# Patient Record
Sex: Male | Born: 1945 | Race: White | Hispanic: No | Marital: Married | Smoking: Never smoker
Health system: Southern US, Community
[De-identification: ages and names within clinical notes are randomized; demographics above are authoritative.]

## PROBLEM LIST (undated history)

## (undated) DIAGNOSIS — N179 Acute kidney failure, unspecified: Secondary | ICD-10-CM

## (undated) DIAGNOSIS — Z9289 Personal history of other medical treatment: Secondary | ICD-10-CM

## (undated) DIAGNOSIS — R51 Headache: Secondary | ICD-10-CM

## (undated) DIAGNOSIS — R112 Nausea with vomiting, unspecified: Secondary | ICD-10-CM

## (undated) DIAGNOSIS — R519 Headache, unspecified: Secondary | ICD-10-CM

## (undated) DIAGNOSIS — K219 Gastro-esophageal reflux disease without esophagitis: Secondary | ICD-10-CM

## (undated) DIAGNOSIS — R011 Cardiac murmur, unspecified: Secondary | ICD-10-CM

## (undated) DIAGNOSIS — Z9889 Other specified postprocedural states: Secondary | ICD-10-CM

## (undated) DIAGNOSIS — C181 Malignant neoplasm of appendix: Secondary | ICD-10-CM

## (undated) DIAGNOSIS — F419 Anxiety disorder, unspecified: Secondary | ICD-10-CM

## (undated) HISTORY — PX: MOLE REMOVAL: SHX2046

## (undated) HISTORY — PX: APPENDECTOMY: SHX54

## (undated) HISTORY — PX: FRACTURE SURGERY: SHX138

## (undated) HISTORY — PX: JOINT REPLACEMENT: SHX530

---

## 1971-06-25 HISTORY — PX: VASECTOMY: SHX75

## 1972-06-24 HISTORY — PX: DENTAL SURGERY: SHX609

## 2010-06-24 DIAGNOSIS — Z9289 Personal history of other medical treatment: Secondary | ICD-10-CM

## 2010-06-24 HISTORY — DX: Personal history of other medical treatment: Z92.89

## 2010-09-22 ENCOUNTER — Inpatient Hospital Stay (HOSPITAL_COMMUNITY)
Admission: EM | Admit: 2010-09-22 | Discharge: 2010-10-02 | DRG: 493 | Disposition: A | Discharge status: Skilled Nursing Facility | Payer: No Typology Code available for payment source | Attending: Orthopedic Surgery | Admitting: Orthopedic Surgery

## 2010-09-22 ENCOUNTER — Emergency Department (HOSPITAL_COMMUNITY): Payer: No Typology Code available for payment source

## 2010-09-22 DIAGNOSIS — S82109A Unspecified fracture of upper end of unspecified tibia, initial encounter for closed fracture: Secondary | ICD-10-CM | POA: Diagnosis present

## 2010-09-22 DIAGNOSIS — K219 Gastro-esophageal reflux disease without esophagitis: Secondary | ICD-10-CM | POA: Diagnosis present

## 2010-09-22 DIAGNOSIS — Y998 Other external cause status: Secondary | ICD-10-CM

## 2010-09-22 DIAGNOSIS — Y92410 Unspecified street and highway as the place of occurrence of the external cause: Secondary | ICD-10-CM

## 2010-09-22 DIAGNOSIS — S82209A Unspecified fracture of shaft of unspecified tibia, initial encounter for closed fracture: Principal | ICD-10-CM | POA: Diagnosis present

## 2010-09-22 DIAGNOSIS — IMO0002 Reserved for concepts with insufficient information to code with codable children: Secondary | ICD-10-CM

## 2010-09-22 DIAGNOSIS — T79A29A Traumatic compartment syndrome of unspecified lower extremity, initial encounter: Secondary | ICD-10-CM | POA: Diagnosis present

## 2010-09-22 LAB — CBC
MCH: 30 pg (ref 26.0–34.0)
MCV: 84.9 fL (ref 78.0–100.0)
Platelets: 128 10*3/uL — ABNORMAL LOW (ref 150–400)
RBC: 4.84 MIL/uL (ref 4.22–5.81)

## 2010-09-22 LAB — POCT I-STAT, CHEM 8
BUN: 16 mg/dL (ref 6–23)
Calcium, Ion: 0.96 mmol/L — ABNORMAL LOW (ref 1.12–1.32)
Chloride: 110 meq/L (ref 96–112)
Creatinine, Ser: 1.2 mg/dL (ref 0.4–1.5)
Glucose, Bld: 104 mg/dL — ABNORMAL HIGH (ref 70–99)

## 2010-09-22 LAB — DIFFERENTIAL
Eosinophils Absolute: 0 10*3/uL (ref 0.0–0.7)
Lymphs Abs: 0.9 10*3/uL (ref 0.7–4.0)
Monocytes Relative: 7 % (ref 3–12)
Neutrophils Relative %: 85 % — ABNORMAL HIGH (ref 43–77)

## 2010-09-23 ENCOUNTER — Inpatient Hospital Stay (HOSPITAL_COMMUNITY): Payer: No Typology Code available for payment source

## 2010-09-23 HISTORY — PX: EXTERNAL FIXATION LEG: SHX1549

## 2010-09-23 HISTORY — PX: TIBIA FRACTURE SURGERY: SHX806

## 2010-09-23 LAB — BASIC METABOLIC PANEL
BUN: 12 mg/dL (ref 6–23)
CO2: 23 mEq/L (ref 19–32)
Chloride: 99 mEq/L (ref 96–112)
Creatinine, Ser: 1.08 mg/dL (ref 0.4–1.5)

## 2010-09-23 LAB — URINE MICROSCOPIC-ADD ON

## 2010-09-23 LAB — URINALYSIS, ROUTINE W REFLEX MICROSCOPIC
Glucose, UA: NEGATIVE mg/dL
Leukocytes, UA: NEGATIVE
pH: 5 (ref 5.0–8.0)

## 2010-09-23 LAB — CBC
HCT: 34.4 % — ABNORMAL LOW (ref 39.0–52.0)
MCHC: 34.9 g/dL (ref 30.0–36.0)
MCV: 86 fL (ref 78.0–100.0)
RDW: 13.7 % (ref 11.5–15.5)

## 2010-09-24 LAB — BASIC METABOLIC PANEL
CO2: 27 mEq/L (ref 19–32)
Calcium: 7.7 mg/dL — ABNORMAL LOW (ref 8.4–10.5)
Chloride: 102 mEq/L (ref 96–112)
Glucose, Bld: 132 mg/dL — ABNORMAL HIGH (ref 70–99)
Sodium: 134 mEq/L — ABNORMAL LOW (ref 135–145)

## 2010-09-24 LAB — CBC
MCH: 29.8 pg (ref 26.0–34.0)
MCV: 85.7 fL (ref 78.0–100.0)
Platelets: 116 10*3/uL — ABNORMAL LOW (ref 150–400)
RBC: 3.36 MIL/uL — ABNORMAL LOW (ref 4.22–5.81)
RDW: 13.8 % (ref 11.5–15.5)

## 2010-09-24 LAB — URINE CULTURE
Culture  Setup Time: 201204011134
Culture: NO GROWTH

## 2010-09-25 LAB — CBC
HCT: 24.6 % — ABNORMAL LOW (ref 39.0–52.0)
Platelets: 100 10*3/uL — ABNORMAL LOW (ref 150–400)
RDW: 13.7 % (ref 11.5–15.5)
WBC: 5.3 10*3/uL (ref 4.0–10.5)

## 2010-09-25 LAB — BASIC METABOLIC PANEL
BUN: 7 mg/dL (ref 6–23)
CO2: 28 mEq/L (ref 19–32)
Calcium: 7.8 mg/dL — ABNORMAL LOW (ref 8.4–10.5)
Creatinine, Ser: 0.98 mg/dL (ref 0.4–1.5)
GFR calc Af Amer: >60 mL/min (ref >60)

## 2010-09-25 LAB — PROTIME-INR
INR: 2.1 — ABNORMAL HIGH (ref 0.00–1.49)
Prothrombin Time: 23.7 seconds — ABNORMAL HIGH (ref 11.6–15.2)

## 2010-09-26 LAB — SURGICAL PCR SCREEN: MRSA, PCR: NEGATIVE

## 2010-09-27 ENCOUNTER — Inpatient Hospital Stay (HOSPITAL_COMMUNITY): Payer: No Typology Code available for payment source

## 2010-09-27 LAB — PROTIME-INR: INR: 1.89 — ABNORMAL HIGH (ref 0.00–1.49)

## 2010-09-28 ENCOUNTER — Inpatient Hospital Stay (HOSPITAL_COMMUNITY): Payer: No Typology Code available for payment source

## 2010-09-28 LAB — BASIC METABOLIC PANEL
Calcium: 7.8 mg/dL — ABNORMAL LOW (ref 8.4–10.5)
GFR calc Af Amer: >60 mL/min (ref >60)
GFR calc non Af Amer: >60 mL/min (ref >60)
Glucose, Bld: 131 mg/dL — ABNORMAL HIGH (ref 70–99)
Potassium: 4.3 mEq/L (ref 3.5–5.1)
Sodium: 129 mEq/L — ABNORMAL LOW (ref 135–145)

## 2010-09-28 LAB — PROTIME-INR
INR: 2.8 — ABNORMAL HIGH (ref 0.00–1.49)
Prothrombin Time: 29.6 seconds — ABNORMAL HIGH (ref 11.6–15.2)

## 2010-09-28 LAB — CBC
HCT: 22 % — ABNORMAL LOW (ref 39.0–52.0)
MCHC: 34.5 g/dL (ref 30.0–36.0)
Platelets: 187 10*3/uL (ref 150–400)
RDW: 13.6 % (ref 11.5–15.5)
WBC: 7.8 10*3/uL (ref 4.0–10.5)

## 2010-09-28 LAB — DIFFERENTIAL
Basophils Absolute: 0 10*3/uL (ref 0.0–0.1)
Basophils Relative: 0 % (ref 0–1)
Eosinophils Relative: 1 % (ref 0–5)
Lymphocytes Relative: 15 % (ref 12–46)
Monocytes Absolute: 0.9 10*3/uL (ref 0.1–1.0)

## 2010-09-29 LAB — CBC
HCT: 22.3 % — ABNORMAL LOW (ref 39.0–52.0)
MCHC: 34.5 g/dL (ref 30.0–36.0)
Platelets: 192 10*3/uL (ref 150–400)
RDW: 13.8 % (ref 11.5–15.5)
WBC: 7.6 10*3/uL (ref 4.0–10.5)

## 2010-09-29 LAB — BASIC METABOLIC PANEL
BUN: 13 mg/dL (ref 6–23)
Calcium: 7.9 mg/dL — ABNORMAL LOW (ref 8.4–10.5)
GFR calc non Af Amer: >60 mL/min (ref >60)
Glucose, Bld: 152 mg/dL — ABNORMAL HIGH (ref 70–99)
Sodium: 130 mEq/L — ABNORMAL LOW (ref 135–145)

## 2010-09-29 LAB — DIFFERENTIAL
Basophils Absolute: 0 10*3/uL (ref 0.0–0.1)
Eosinophils Absolute: 0.1 10*3/uL (ref 0.0–0.7)
Eosinophils Relative: 1 % (ref 0–5)
Lymphocytes Relative: 17 % (ref 12–46)
Monocytes Absolute: 0.7 10*3/uL (ref 0.1–1.0)

## 2010-09-29 LAB — URINALYSIS, ROUTINE W REFLEX MICROSCOPIC
Bilirubin Urine: NEGATIVE
Ketones, ur: NEGATIVE mg/dL
Nitrite: NEGATIVE
Urobilinogen, UA: 0.2 mg/dL (ref 0.0–1.0)

## 2010-09-30 ENCOUNTER — Inpatient Hospital Stay (HOSPITAL_COMMUNITY): Payer: No Typology Code available for payment source

## 2010-09-30 LAB — BASIC METABOLIC PANEL
BUN: 12 mg/dL (ref 6–23)
CO2: 28 mEq/L (ref 19–32)
Calcium: 8 mg/dL — ABNORMAL LOW (ref 8.4–10.5)
Creatinine, Ser: 0.96 mg/dL (ref 0.4–1.5)
Glucose, Bld: 114 mg/dL — ABNORMAL HIGH (ref 70–99)
Sodium: 135 mEq/L (ref 135–145)

## 2010-09-30 LAB — DIFFERENTIAL
Eosinophils Absolute: 0.1 10*3/uL (ref 0.0–0.7)
Eosinophils Relative: 3 % (ref 0–5)
Lymphs Abs: 1.2 10*3/uL (ref 0.7–4.0)
Monocytes Absolute: 0.5 10*3/uL (ref 0.1–1.0)
Monocytes Relative: 9 % (ref 3–12)

## 2010-09-30 LAB — CBC
MCH: 28.9 pg (ref 26.0–34.0)
MCHC: 33.6 g/dL (ref 30.0–36.0)
MCV: 86.1 fL (ref 78.0–100.0)
Platelets: 187 10*3/uL (ref 150–400)
RDW: 14 % (ref 11.5–15.5)
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
## 2010-10-02 LAB — CROSSMATCH
ABO/RH(D): O NEG
Antibody Screen: NEGATIVE
Unit division: 0

## 2010-10-02 LAB — PROTIME-INR: Prothrombin Time: 22.7 seconds — ABNORMAL HIGH (ref 11.6–15.2)

## 2010-10-16 NOTE — Discharge Summary (Signed)
  Christopher Potts, Christopher Potts              ACCOUNT NO.:  [id number]  MEDICAL RECORD NO.:  [id number]           PATIENT TYPE:  I  LOCATION:  5003                         FACILITY:  [facility]  PHYSICIAN:  Nadara Mustard, MD     DATE OF BIRTH:  [birth year]  DATE OF ADMISSION:  09/22/2010 DATE OF DISCHARGE:  10/01/2010                              DISCHARGE SUMMARY   FINAL DIAGNOSES: 1. Right mid shaft tibia and fibular fracture. 2. Comminuted bicondylar tibial plateau fracture on the left as well     as avulsion of tibial tubercle.  Discharged to skilled nursing in stable condition.  Follow up with Dr. Lajoyce Corners in 1-2 weeks.  MEDICATIONS:  As per his admission medications.  NEW MEDICATIONS: 1. Vicodin 1-2 p.o. q.4 h. p.r.n. for pain. 2. Coumadin 1 mg p.o. daily.  INR measurements not necessary, Coumadin     for 30 days. 3. Bactroban prescription for pin tract care.  The patient's pin     tracts are to be cleaned with half-strength hydrogen peroxide twice     a day and then Bactroban cream applied to the pin tracts on the     left external fixator 2 times a day and Bactroban to be applied 2     times a day to the abrasion wound over the tibial tubercle left     knee.  Physical therapy, transfer training, weightbearing as     tolerated on the right with strict nonweightbearing on the left     lower extremity.  Discharged to skilled nursing in stable     condition.     Nadara Mustard, MD     MVD/MEDQ  D:  10/01/2010  T:  10/01/2010  Job:  [id number]  Electronically Signed by [name] MD on 10/16/2010 02:55:29 PM

## 2011-08-23 HISTORY — PX: ILEOSTOMY: SHX1783

## 2011-08-23 HISTORY — PX: COLECTOMY: SHX59

## 2011-08-30 ENCOUNTER — Other Ambulatory Visit (HOSPITAL_COMMUNITY): Payer: Self-pay | Admitting: Orthopedic Surgery

## 2011-09-04 ENCOUNTER — Encounter (HOSPITAL_COMMUNITY)
Admission: RE | Admit: 2011-09-04 | Discharge: 2011-09-04 | Disposition: A | Discharge status: Home / Self Care | Payer: Medicare Other | Source: Ambulatory Visit | Attending: Orthopedic Surgery | Admitting: Orthopedic Surgery

## 2011-09-04 ENCOUNTER — Encounter (HOSPITAL_COMMUNITY): Payer: Self-pay

## 2011-09-04 ENCOUNTER — Encounter (HOSPITAL_COMMUNITY): Payer: Self-pay | Admitting: Pharmacy Technician

## 2011-09-04 ENCOUNTER — Other Ambulatory Visit: Payer: Self-pay

## 2011-09-04 HISTORY — DX: Nausea with vomiting, unspecified: Z98.890

## 2011-09-04 HISTORY — DX: Cardiac murmur, unspecified: R01.1

## 2011-09-04 HISTORY — DX: Nausea with vomiting, unspecified: R11.2

## 2011-09-04 HISTORY — DX: Gastro-esophageal reflux disease without esophagitis: K21.9

## 2011-09-04 LAB — COMPREHENSIVE METABOLIC PANEL
Alkaline Phosphatase: 107 U/L (ref 39–117)
BUN: 12 mg/dL (ref 6–23)
Chloride: 105 mEq/L (ref 96–112)
GFR calc Af Amer: >90 mL/min (ref >90)
Glucose, Bld: 89 mg/dL (ref 70–99)
Potassium: 4.6 mEq/L (ref 3.5–5.1)
Total Bilirubin: 0.8 mg/dL (ref 0.3–1.2)

## 2011-09-04 LAB — CBC
HCT: 44.7 % (ref 39.0–52.0)
Hemoglobin: 16.2 g/dL (ref 13.0–17.0)
RBC: 5.3 MIL/uL (ref 4.22–5.81)
WBC: 4.3 10*3/uL (ref 4.0–10.5)

## 2011-09-04 LAB — APTT: aPTT: 32 seconds (ref 24–37)

## 2011-09-04 LAB — PROTIME-INR: Prothrombin Time: 13 seconds (ref 11.6–15.2)

## 2011-09-04 NOTE — Pre-Procedure Instructions (Addendum)
20 Christopher Potts  09/04/2011   Your procedure is scheduled on:  3.21.13  Report to [facility] at 530 AM.  Call this number if you have problems the morning of surgery: [phone]   Remember:   Do not eat food:After Midnight.  May have clear liquids: up to 4 Hours before arrival 130 am.  Clear liquids include soda, tea, black coffee, apple or grape juice, broth.  Take these medicines the morning of surgery with A SIP OF WATER: zantac, tylenol if needed   Do not wear jewelry, make-up or nail polish.  Do not wear lotions, powders, or perfumes. You may wear deodorant.  Do not shave 48 hours prior to surgery.  Do not bring valuables to the hospital.  Contacts, dentures or bridgework may not be worn into surgery.  Leave suitcase in the car. After surgery it may be brought to your room.  For patients admitted to the hospital, checkout time is 11:00 AM the day of discharge.   Patients discharged the day of surgery will not be allowed to drive home.  Name and phone number of your driver: [phone]  Special Instructions: CHG Shower Use Special Wash: 1/2 bottle night before surgery and 1/2 bottle morning of surgery.   Please read over the following fact sheets that you were given: Pain Booklet, Coughing and Deep Breathing, MRSA Information and Surgical Site Infection Prevention

## 2011-09-04 NOTE — Pre-Procedure Instructions (Signed)
20 Christopher Potts  09/04/2011   Your procedure is scheduled on: 3.21.13  Report to [facility] at 530 AM.  Call this number if you have problems the morning of surgery: [phone]   Remember:   Do not eat food:After Midnight.  May have clear liquids: up to 4 Hours before arrival 130 am.  Clear liquids include soda, tea, black coffee, apple or grape juice, broth.  Take these medicines the morning of surgery with A SIP OF WATER:    Do not wear jewelry, make-up or nail polish.  Do not wear lotions, powders, or perfumes. You may wear deodorant.  Do not shave 48 hours prior to surgery.  Do not bring valuables to the hospital.  Contacts, dentures or bridgework may not be worn into surgery.  Leave suitcase in the car. After surgery it may be brought to your room.  For patients admitted to the hospital, checkout time is 11:00 AM the day of discharge.   Patients discharged the day of surgery will not be allowed to drive home.  Name and phone number of your driver:   Special Instructions: Incentive Spirometry - Practice and bring it with you on the day of surgery. and CHG Shower Use Special Wash: 1/2 bottle night before surgery and 1/2 bottle morning of surgery.   Please read over the following fact sheets that you were given: Pain Booklet, Coughing and Deep Breathing, Blood Transfusion Information, Total Joint Packet, MRSA Information and Surgical Site Infection Prevention

## 2011-09-11 MED ORDER — CEFAZOLIN SODIUM-DEXTROSE 2-3 GM-% IV SOLR
2.0000 g | INTRAVENOUS | Status: DC
  Filled 2011-09-11: qty 50

## 2011-09-12 ENCOUNTER — Encounter (HOSPITAL_COMMUNITY)
Admission: RE | Disposition: A | Discharge status: Home / Self Care | Payer: Self-pay | Source: Ambulatory Visit | Attending: Orthopedic Surgery

## 2011-09-12 ENCOUNTER — Encounter (HOSPITAL_COMMUNITY): Payer: Self-pay | Admitting: Certified Registered"

## 2011-09-12 ENCOUNTER — Ambulatory Visit (HOSPITAL_COMMUNITY): Payer: Medicare Other

## 2011-09-12 ENCOUNTER — Ambulatory Visit (HOSPITAL_COMMUNITY): Payer: Medicare Other | Admitting: Certified Registered"

## 2011-09-12 ENCOUNTER — Inpatient Hospital Stay (HOSPITAL_COMMUNITY)
Admission: RE | Admit: 2011-09-12 | Discharge: 2011-09-16 | DRG: 470 | Disposition: A | Discharge status: Home Health | Payer: Medicare Other | Source: Ambulatory Visit | Attending: Orthopedic Surgery | Admitting: Orthopedic Surgery

## 2011-09-12 DIAGNOSIS — M12569 Traumatic arthropathy, unspecified knee: Principal | ICD-10-CM | POA: Diagnosis present

## 2011-09-12 DIAGNOSIS — Z01812 Encounter for preprocedural laboratory examination: Secondary | ICD-10-CM

## 2011-09-12 DIAGNOSIS — M1710 Unilateral primary osteoarthritis, unspecified knee: Secondary | ICD-10-CM

## 2011-09-12 DIAGNOSIS — K219 Gastro-esophageal reflux disease without esophagitis: Secondary | ICD-10-CM | POA: Diagnosis present

## 2011-09-12 HISTORY — PX: TOTAL KNEE ARTHROPLASTY: SHX125

## 2011-09-12 SURGERY — ARTHROPLASTY, KNEE, TOTAL
Anesthesia: General | Site: Knee | Laterality: Left | Wound class: Clean

## 2011-09-12 MED ORDER — HYDROCODONE-ACETAMINOPHEN 5-325 MG PO TABS
1.0000 | ORAL_TABLET | ORAL | Status: DC | PRN
  Administered 2011-09-13 – 2011-09-14 (×3): 1 via ORAL
  Administered 2011-09-15: 2 via ORAL
  Filled 2011-09-12 (×2): qty 1
  Filled 2011-09-12: qty 2
  Filled 2011-09-12 (×2): qty 1

## 2011-09-12 MED ORDER — WARFARIN VIDEO: Freq: Once | Status: DC

## 2011-09-12 MED ORDER — KETOROLAC TROMETHAMINE 30 MG/ML IJ SOLN: 15.0000 mg | Freq: Once | INTRAMUSCULAR | Status: DC | PRN

## 2011-09-12 MED ORDER — SUFENTANIL CITRATE 50 MCG/ML IV SOLN
INTRAVENOUS | Status: DC | PRN
  Administered 2011-09-12: 35 ug via INTRAVENOUS
  Administered 2011-09-12 (×2): 10 ug via INTRAVENOUS
  Administered 2011-09-12: 5 ug via INTRAVENOUS
  Administered 2011-09-12 (×3): 10 ug via INTRAVENOUS

## 2011-09-12 MED ORDER — MORPHINE SULFATE 2 MG/ML IJ SOLN: 1.0000 mg | INTRAMUSCULAR | Status: DC | PRN

## 2011-09-12 MED ORDER — MORPHINE SULFATE (PF) 1 MG/ML IV SOLN
INTRAVENOUS | Status: DC
  Administered 2011-09-12: 17:00:00 via INTRAVENOUS
  Administered 2011-09-12: 4.5 mg via INTRAVENOUS
  Administered 2011-09-13: 23 mg via INTRAVENOUS
  Administered 2011-09-13: 9 mg via INTRAVENOUS

## 2011-09-12 MED ORDER — DIPHENHYDRAMINE HCL 50 MG/ML IJ SOLN: 12.5000 mg | Freq: Four times a day (QID) | INTRAMUSCULAR | Status: DC | PRN

## 2011-09-12 MED ORDER — VANCOMYCIN HCL 500 MG IV SOLR
500.0000 mg | INTRAVENOUS | Status: DC
  Filled 2011-09-12: qty 500

## 2011-09-12 MED ORDER — METHOCARBAMOL 100 MG/ML IJ SOLN
500.0000 mg | Freq: Four times a day (QID) | INTRAVENOUS | Status: DC | PRN
  Filled 2011-09-12: qty 5

## 2011-09-12 MED ORDER — FERROUS SULFATE 325 (65 FE) MG PO TABS
325.0000 mg | ORAL_TABLET | Freq: Three times a day (TID) | ORAL | Status: DC
  Administered 2011-09-12 – 2011-09-16 (×10): 325 mg via ORAL
  Filled 2011-09-12 (×14): qty 1

## 2011-09-12 MED ORDER — ONDANSETRON HCL 4 MG/2ML IJ SOLN
INTRAMUSCULAR | Status: DC | PRN
  Administered 2011-09-12 (×2): 4 mg via INTRAVENOUS

## 2011-09-12 MED ORDER — ONDANSETRON HCL 4 MG/2ML IJ SOLN
4.0000 mg | Freq: Four times a day (QID) | INTRAMUSCULAR | Status: DC | PRN
  Administered 2011-09-12 – 2011-09-13 (×3): 4 mg via INTRAVENOUS
  Filled 2011-09-12 (×2): qty 2

## 2011-09-12 MED ORDER — MIDAZOLAM HCL 5 MG/5ML IJ SOLN
INTRAMUSCULAR | Status: DC | PRN
  Administered 2011-09-12: 2 mg via INTRAVENOUS

## 2011-09-12 MED ORDER — LACTATED RINGERS IV SOLN
INTRAVENOUS | Status: DC | PRN
  Administered 2011-09-12 (×3): via INTRAVENOUS

## 2011-09-12 MED ORDER — COUMADIN BOOK
Freq: Once | Status: AC
  Administered 2011-09-12: 15:00:00
  Filled 2011-09-12: qty 1

## 2011-09-12 MED ORDER — NALOXONE HCL 0.4 MG/ML IJ SOLN: 0.4000 mg | INTRAMUSCULAR | Status: DC | PRN

## 2011-09-12 MED ORDER — MIDAZOLAM HCL 2 MG/2ML IJ SOLN: 0.5000 mg | Freq: Once | INTRAMUSCULAR | Status: DC | PRN

## 2011-09-12 MED ORDER — ACETAMINOPHEN 10 MG/ML IV SOLN
INTRAVENOUS | Status: AC
  Filled 2011-09-12: qty 100

## 2011-09-12 MED ORDER — CEFAZOLIN SODIUM 1-5 GM-% IV SOLN
INTRAVENOUS | Status: DC | PRN
  Administered 2011-09-12: 2 g via INTRAVENOUS

## 2011-09-12 MED ORDER — EPHEDRINE SULFATE 50 MG/ML IJ SOLN
INTRAMUSCULAR | Status: DC | PRN
  Administered 2011-09-12 (×4): 10 mg via INTRAVENOUS

## 2011-09-12 MED ORDER — LIDOCAINE HCL 4 % MT SOLN
OROMUCOSAL | Status: DC | PRN
  Administered 2011-09-12: 4 mL via TOPICAL

## 2011-09-12 MED ORDER — WARFARIN - PHARMACIST DOSING INPATIENT: Freq: Every day | Status: DC

## 2011-09-12 MED ORDER — FENTANYL CITRATE 0.05 MG/ML IJ SOLN
25.0000 ug | INTRAMUSCULAR | Status: DC | PRN
  Administered 2011-09-12 (×2): 50 ug via INTRAVENOUS

## 2011-09-12 MED ORDER — DIPHENHYDRAMINE HCL 12.5 MG/5ML PO ELIX: 12.5000 mg | ORAL_SOLUTION | Freq: Four times a day (QID) | ORAL | Status: DC | PRN

## 2011-09-12 MED ORDER — OXYCODONE-ACETAMINOPHEN 5-325 MG PO TABS: 1.0000 | ORAL_TABLET | ORAL | Status: DC | PRN

## 2011-09-12 MED ORDER — ONDANSETRON HCL 4 MG/2ML IJ SOLN
4.0000 mg | Freq: Four times a day (QID) | INTRAMUSCULAR | Status: DC | PRN
  Filled 2011-09-12: qty 2

## 2011-09-12 MED ORDER — DEXAMETHASONE SODIUM PHOSPHATE 4 MG/ML IJ SOLN
INTRAMUSCULAR | Status: DC | PRN
  Administered 2011-09-12: 4 mg via INTRAVENOUS

## 2011-09-12 MED ORDER — MEPERIDINE HCL 25 MG/ML IJ SOLN: 6.2500 mg | INTRAMUSCULAR | Status: DC | PRN

## 2011-09-12 MED ORDER — CEFAZOLIN SODIUM-DEXTROSE 2-3 GM-% IV SOLR
2.0000 g | Freq: Four times a day (QID) | INTRAVENOUS | Status: AC
  Administered 2011-09-12 – 2011-09-13 (×3): 2 g via INTRAVENOUS
  Filled 2011-09-12 (×3): qty 50

## 2011-09-12 MED ORDER — GENTAMICIN SULFATE 40 MG/ML IJ SOLN
INTRAMUSCULAR | Status: DC | PRN
  Administered 2011-09-12: 240 mg

## 2011-09-12 MED ORDER — METHOCARBAMOL 500 MG PO TABS
500.0000 mg | ORAL_TABLET | Freq: Four times a day (QID) | ORAL | Status: DC | PRN
  Administered 2011-09-13 – 2011-09-14 (×3): 500 mg via ORAL
  Filled 2011-09-12 (×4): qty 1

## 2011-09-12 MED ORDER — ONDANSETRON HCL 4 MG PO TABS: 4.0000 mg | ORAL_TABLET | Freq: Four times a day (QID) | ORAL | Status: DC | PRN

## 2011-09-12 MED ORDER — ACETAMINOPHEN 10 MG/ML IV SOLN
INTRAVENOUS | Status: DC | PRN
  Administered 2011-09-12: 1000 mg via INTRAVENOUS

## 2011-09-12 MED ORDER — PROPOFOL 10 MG/ML IV EMUL
INTRAVENOUS | Status: DC | PRN
  Administered 2011-09-12: 200 mg via INTRAVENOUS

## 2011-09-12 MED ORDER — SODIUM CHLORIDE 0.9 % IR SOLN
Status: DC | PRN
  Administered 2011-09-12: 1000 mL
  Administered 2011-09-12: 3000 mL

## 2011-09-12 MED ORDER — FAMOTIDINE 10 MG PO TABS
10.0000 mg | ORAL_TABLET | Freq: Every day | ORAL | Status: DC
  Administered 2011-09-13 – 2011-09-16 (×4): 10 mg via ORAL
  Filled 2011-09-12 (×4): qty 1

## 2011-09-12 MED ORDER — ROCURONIUM BROMIDE 100 MG/10ML IV SOLN
INTRAVENOUS | Status: DC | PRN
  Administered 2011-09-12: 50 mg via INTRAVENOUS
  Administered 2011-09-12 (×2): 10 mg via INTRAVENOUS

## 2011-09-12 MED ORDER — MORPHINE SULFATE (PF) 1 MG/ML IV SOLN
INTRAVENOUS | Status: AC
  Filled 2011-09-12: qty 25

## 2011-09-12 MED ORDER — SODIUM CHLORIDE 0.9 % IV SOLN
INTRAVENOUS | Status: DC
  Administered 2011-09-12: 20 mL/h via INTRAVENOUS

## 2011-09-12 MED ORDER — METOCLOPRAMIDE HCL 10 MG PO TABS: 5.0000 mg | ORAL_TABLET | Freq: Three times a day (TID) | ORAL | Status: DC | PRN

## 2011-09-12 MED ORDER — WARFARIN SODIUM 7.5 MG PO TABS
7.5000 mg | ORAL_TABLET | Freq: Once | ORAL | Status: AC
  Administered 2011-09-12: 7.5 mg via ORAL
  Filled 2011-09-12: qty 1

## 2011-09-12 MED ORDER — SODIUM CHLORIDE 0.9 % IJ SOLN: 9.0000 mL | INTRAMUSCULAR | Status: DC | PRN

## 2011-09-12 MED ORDER — GLYCOPYRROLATE 0.2 MG/ML IJ SOLN
INTRAMUSCULAR | Status: DC | PRN
  Administered 2011-09-12: .8 mg via INTRAVENOUS

## 2011-09-12 MED ORDER — PROMETHAZINE HCL 25 MG/ML IJ SOLN: 6.2500 mg | INTRAMUSCULAR | Status: DC | PRN

## 2011-09-12 MED ORDER — LIDOCAINE HCL (CARDIAC) 20 MG/ML IV SOLN
INTRAVENOUS | Status: DC | PRN
  Administered 2011-09-12: 100 mg via INTRAVENOUS

## 2011-09-12 MED ORDER — NEOSTIGMINE METHYLSULFATE 1 MG/ML IJ SOLN
INTRAMUSCULAR | Status: DC | PRN
  Administered 2011-09-12: 5 mg via INTRAVENOUS

## 2011-09-12 MED ORDER — VANCOMYCIN HCL 500 MG IV SOLR
INTRAVENOUS | Status: DC | PRN
  Administered 2011-09-12: 500 mg

## 2011-09-12 MED ORDER — ACETAMINOPHEN 325 MG PO TABS: 325.0000 mg | ORAL_TABLET | ORAL | Status: DC | PRN

## 2011-09-12 MED ORDER — METOCLOPRAMIDE HCL 5 MG/ML IJ SOLN
5.0000 mg | Freq: Three times a day (TID) | INTRAMUSCULAR | Status: DC | PRN
  Administered 2011-09-13: 10 mg via INTRAVENOUS
  Filled 2011-09-12: qty 2

## 2011-09-12 SURGICAL SUPPLY — 64 items
BLADE KNIFE  20 PERSONNA (BLADE) ×3
BLADE KNIFE 20 PERSONNA (BLADE) ×3 IMPLANT
BLADE SAG 18X100X1.27 (BLADE) ×2 IMPLANT
BLADE SAGITTAL 25.0X1.27X90 (BLADE) ×2 IMPLANT
BLADE SAW SAG 90X13X1.27 (BLADE) ×2 IMPLANT
BLADE SAW SGTL 13.0X1.19X90.0M (BLADE) ×2 IMPLANT
BNDG COHESIVE 6X5 TAN STRL LF (GAUZE/BANDAGES/DRESSINGS) ×2 IMPLANT
BONE CEMENT PALACOSE (Orthopedic Implant) ×4 IMPLANT
BOWL SMART MIX CTS (DISPOSABLE) ×2 IMPLANT
CEMENT BONE PALACOSE (Orthopedic Implant) ×2 IMPLANT
CLOTH BEACON ORANGE TIMEOUT ST (SAFETY) ×2 IMPLANT
COVER BACK TABLE 24X17X13 BIG (DRAPES) ×2 IMPLANT
COVER SURGICAL LIGHT HANDLE (MISCELLANEOUS) ×2 IMPLANT
CUFF TOURNIQUET SINGLE 34IN LL (TOURNIQUET CUFF) ×2 IMPLANT
CUFF TOURNIQUET SINGLE 44IN (TOURNIQUET CUFF) IMPLANT
DRAPE EXTREMITY T 121X128X90 (DRAPE) ×2 IMPLANT
DRAPE PROXIMA HALF (DRAPES) ×2 IMPLANT
DRAPE U-SHAPE 47X51 STRL (DRAPES) ×2 IMPLANT
DRSG ADAPTIC 3X8 NADH LF (GAUZE/BANDAGES/DRESSINGS) ×2 IMPLANT
DRSG PAD ABDOMINAL 8X10 ST (GAUZE/BANDAGES/DRESSINGS) ×2 IMPLANT
DURAPREP 26ML APPLICATOR (WOUND CARE) ×2 IMPLANT
ELECT REM PT RETURN 9FT ADLT (ELECTROSURGICAL) ×2
ELECTRODE REM PT RTRN 9FT ADLT (ELECTROSURGICAL) ×1 IMPLANT
FACESHIELD LNG OPTICON STERILE (SAFETY) ×2 IMPLANT
GLOVE BIOGEL PI IND STRL 9 (GLOVE) ×1 IMPLANT
GLOVE BIOGEL PI INDICATOR 9 (GLOVE) ×1
GLOVE SURG ORTHO 9.0 STRL STRW (GLOVE) ×2 IMPLANT
GOWN PREVENTION PLUS XLARGE (GOWN DISPOSABLE) ×2 IMPLANT
GOWN SRG XL XLNG 56XLVL 4 (GOWN DISPOSABLE) ×2 IMPLANT
GOWN STRL NON-REIN XL XLG LVL4 (GOWN DISPOSABLE) ×2
HANDPIECE INTERPULSE COAX TIP (DISPOSABLE) ×1
KIT BASIN OR (CUSTOM PROCEDURE TRAY) ×2 IMPLANT
KIT ROOM TURNOVER OR (KITS) ×2 IMPLANT
KIT STIMULAN RAPID CURE  10CC (Orthopedic Implant) ×1 IMPLANT
KIT STIMULAN RAPID CURE 10CC (Orthopedic Implant) ×1 IMPLANT
MANIFOLD NEPTUNE II (INSTRUMENTS) ×2 IMPLANT
NEEDLE SPNL 18GX3.5 QUINCKE PK (NEEDLE) ×2 IMPLANT
NS IRRIG 1000ML POUR BTL (IV SOLUTION) ×2 IMPLANT
PACK TOTAL JOINT (CUSTOM PROCEDURE TRAY) ×2 IMPLANT
PAD ARMBOARD 7.5X6 YLW CONV (MISCELLANEOUS) ×4 IMPLANT
PADDING CAST COTTON 6X4 STRL (CAST SUPPLIES) ×2 IMPLANT
PATELLA ZIMMER 32MM (Orthopedic Implant) ×2 IMPLANT
SET HNDPC FAN SPRY TIP SCT (DISPOSABLE) ×1 IMPLANT
SPONGE GAUZE 4X4 12PLY (GAUZE/BANDAGES/DRESSINGS) ×2 IMPLANT
STAPLER VISISTAT 35W (STAPLE) ×2 IMPLANT
STEM ST EXT ZIER 100X145X12X (Stem) ×1 IMPLANT
STEM ST EXT ZIMMER (Stem) ×3 IMPLANT
SUCTION FRAZIER TIP 10 FR DISP (SUCTIONS) ×2 IMPLANT
SUT VIC AB 0 CTB1 27 (SUTURE) ×4 IMPLANT
SUT VIC AB 1 CTX 36 (SUTURE) ×1
SUT VIC AB 1 CTX36XBRD ANBCTR (SUTURE) ×1 IMPLANT
SUT VIC AB 2-0 CTB1 (SUTURE) ×2 IMPLANT
SYR 50ML SLIP (SYRINGE) ×2 IMPLANT
TOWEL OR 17X24 6PK STRL BLUE (TOWEL DISPOSABLE) ×2 IMPLANT
TOWEL OR 17X26 10 PK STRL BLUE (TOWEL DISPOSABLE) ×2 IMPLANT
TRAY FOLEY CATH 14FR (SET/KITS/TRAYS/PACK) ×2 IMPLANT
WATER STERILE IRR 1000ML POUR (IV SOLUTION) ×6 IMPLANT
WRAP KNEE MAXI GEL POST OP (GAUZE/BANDAGES/DRESSINGS) ×2 IMPLANT
YANKAUER SUCT BULB TIP NO VENT (SUCTIONS) ×2 IMPLANT
rotating hinge knee articular surface, size F, 26m (Knees) ×2 IMPLANT
rotating hinge knee femoral compnent (Knees) ×2 IMPLANT
rotating hinge knee tibial componenet size 5 (Knees) ×2 IMPLANT
tibial half block augment size 5, 15 mm (Knees) ×2 IMPLANT
tibial half block augment, size 5, 15mm (Knees) ×2 IMPLANT

## 2011-09-12 NOTE — Progress Notes (Signed)
At 16:15 alerted by pt of bloody drainage from left knee onto bedding.  Noted moderate size bloody/serosanguinous stain on bedding, left knee dressing reinforced with abd pads and ace wrap. Drainage appear to be exiting at top of post op compressing dressing. Pt denies any increased pain or change in sensation in limb. capillary refill <3 secs, skin color appropriate and limb warm to touch. Elita Quick RN

## 2011-09-12 NOTE — Anesthesia Postprocedure Evaluation (Signed)
Anesthesia Post Note  Patient: Christopher Potts  Procedure(s) Performed: Procedure(s) (LRB): TOTAL KNEE ARTHROPLASTY (Left)  Anesthesia type: GA  Patient location: PACU  Post pain: Pain level controlled  Post assessment: Post-op Vital signs reviewed  Last Vitals:  Filed Vitals:   09/12/11 1145  BP: 140/78  Pulse: 95  Temp:   Resp: 12    Post vital signs: Reviewed  Level of consciousness: sedated  Complications: No apparent anesthesia complications
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
## 2011-09-12 NOTE — Anesthesia Procedure Notes (Signed)
Procedure Name: Intubation Date/Time: 09/12/2011 7:51 AM Performed by: Glendora Score A Pre-anesthesia Checklist: Patient identified, Emergency Drugs available, Suction available and Patient being monitored Patient Re-evaluated:Patient Re-evaluated prior to inductionOxygen Delivery Method: Circle system utilized Preoxygenation: Pre-oxygenation with 100% oxygen Intubation Type: IV induction Ventilation: Mask ventilation without difficulty Laryngoscope Size: Miller and 2 Grade View: Grade I Tube type: Oral Tube size: 7.0 mm Number of attempts: 1 Airway Equipment and Method: Stylet and LTA kit utilized Placement Confirmation: ETT inserted through vocal cords under direct vision,  positive ETCO2 and breath sounds checked- equal and bilateral Secured at: 23 cm Tube secured with: Tape Dental Injury: Teeth and Oropharynx as per pre-operative assessment

## 2011-09-12 NOTE — Progress Notes (Signed)
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 Fish Allergy Swelling    When eat fish and strawberries together but can eat separately   . Strawberry     When eaten with Fish, but can have separately     Patient Measurements:   Wt 95.1 kg Ht 73 inches  Vital Signs: Temp: 97.4 F (36.3 C) (03/21 1245) Temp src: Oral (03/21 0619) BP: 135/80 mmHg (03/21 1315) Pulse Rate: 82  (03/21 1315)  Labs: No results found for this basename: HGB:2,HCT:3,PLT:3,APTT:3,LABPROT:3,INR:3,HEPARINUNFRC:3,CREATININE:3,CKTOTAL:3,CKMB:3,TROPONINI:3 in the last 72 hours CrCl is unknown because there is no height on file for the current visit.  Medical History: Past Medical History  Diagnosis Date  . PONV (postoperative nausea and vomiting)     hx but no problem last surgery  . Heart murmur   . Blood transfusion   . GERD (gastroesophageal reflux disease)     Medications:  Prescriptions prior to admission  Medication Sig Dispense Refill  . acetaminophen (TYLENOL) 500 MG tablet Take 500 mg by mouth every 6 (six) hours as needed. For migraines      . ranitidine (ZANTAC) 75 MG tablet Take 75 mg by mouth daily as needed. For acid reflux        Assessment: 66 y.o. male to begin coumadin for VTE prophylaxis s/p L TKA. Baseline INR 0.96.  Goal of Therapy:  INR 2-3   Plan:  1. Coumadin 7.5mg  po today 2. Daily INR 3. Educational coumadin book and video  Christoper Fabian, PharmD, BCPS Clinical pharmacist, pager [phone] 09/12/2011,2:32 PM

## 2011-09-12 NOTE — Anesthesia Preprocedure Evaluation (Addendum)
Anesthesia Evaluation  Patient identified by MRN, date of birth, ID band Patient awake    Reviewed: Allergy & Precautions, H&P , NPO status , Patient's Chart, lab work & pertinent test results  History of Anesthesia Complications (+) PONV  Airway Mallampati: I TM Distance: >3 FB Neck ROM: Full    Dental  (+) Teeth Intact   Pulmonary          Cardiovascular + Valvular Problems/Murmurs     Neuro/Psych    GI/Hepatic GERD-  Medicated,  Endo/Other    Renal/GU      Musculoskeletal   Abdominal   Peds  Hematology   Anesthesia Other Findings   Reproductive/Obstetrics                          Anesthesia Physical Anesthesia Plan  ASA: II  Anesthesia Plan: General   Post-op Pain Management:    Induction: Intravenous  Airway Management Planned: Oral ETT  Additional Equipment:   Intra-op Plan:   Post-operative Plan: Extubation in OR  Informed Consent: I have reviewed the patients History and Physical, chart, labs and discussed the procedure including the risks, benefits and alternatives for the proposed anesthesia with the patient or authorized representative who has indicated his/her understanding and acceptance.   Dental advisory given  Plan Discussed with: CRNA, Anesthesiologist and Surgeon  Anesthesia Plan Comments:         Anesthesia Quick Evaluation

## 2011-09-12 NOTE — Transfer of Care (Signed)
Immediate Anesthesia Transfer of Care Note  Patient: Christopher Potts  Procedure(s) Performed: Procedure(s) (LRB): TOTAL KNEE ARTHROPLASTY (Left)  Patient Location: PACU  Anesthesia Type: General  Level of Consciousness: awake and alert   Airway & Oxygen Therapy: Patient Spontanous Breathing and Patient connected to nasal cannula oxygen  Post-op Assessment: Report given to PACU RN and Post -op Vital signs reviewed and stable  Post vital signs: Reviewed and stable  Complications: No apparent anesthesia complications

## 2011-09-12 NOTE — Op Note (Signed)
OPERATIVE REPORT  DATE OF SURGERY: 09/12/2011  PATIENT:  Christopher Potts,  66 y.o. male  PRE-OPERATIVE DIAGNOSIS:  Traumatic osteoarthritis Left Knee  POST-OPERATIVE DIAGNOSIS:  Traumatic osteoarthritis Left Knee  PROCEDURE:  Procedure(s): TOTAL KNEE ARTHROPLASTY Zimmer components. Size F. knee with 20 x 100 mm stem. #5 tibia with lateral and medial 15 mm augments and 12 x 100 mm stem. 26 mm rotating hinged knee articular surface. 32 mm patella patella. Removal of deep retained hardware. Application of antibiotic beads stimulant cement and 500 mg of vancomycin and 240 mg gentamicin   SURGEON:  Surgeon(s): Nadara Mustard, MD  ANESTHESIA:   general  EBL:  Minimal ML  SPECIMEN:  No Specimen  TOURNIQUET:  * Missing tourniquet times found for documented tourniquets in log:  [id number] * Tourniquet time 106 minutes. PROCEDURE DETAILS: Patient is a 66 year old gentleman who is status post ORIF for a bicondylar comminuted tibial plateau fracture. Patient is over a year out from surgery he has an unstable knee with varus and valgus stress has pain with weightbearing and presents at this time for revision to a hinged total knee arthroplasty due to 2 ligamentous laxity. Risks and benefits of surgery were discussed including infection neurovascular injury persistent pain failure of the hardware or fracture the bone risk for DVT. Patient states he understands was pursued this time. Description of procedure patient brought to OR room tendon underwent a general anesthetic. After adequate levels of anesthesia were obtained patient's left lower extremity was prepped using DuraPrep and draped into a sterile field. A midline incision was made just medial to the previous area of the open traumatic wound. This was carried down to the retinaculum this was incised medially. Attention was first focused on the femur. The distal cut and the femur was made at 10 mm and box cuts were then made for the femoral size.  The femur canal was then sequentially broached to 20 mm for the 20 mm stem. Box cuts were also made for the femur. Attention was then focused on the tibia. The tibia a proximal aspect was freshened and cut. The deep retained hardware both medially and laterally were removed without complications. The wound was irrigated with normal saline. The tibia was sequentially reamed up to 13 mm and the 12 mm stem had a good fit after broaching. This was then trialed and the 15 mm metal augments plus the 26 mm poly-tray had full extension and restored the patient's leg length. The patella was resurfaced and 10 mm was taken the patella for a 32 mm patella button. The wound is irrigated the skin and soft tissue was debrided with pulsatile lavage. The antibiotic beads were made on the back table these were placed deep in the popliteal space. The tibia and femoral components were cemented in place with the knee locked in extension until the cement hardened. The patella was also clamped 11 clamp until the cement hardened. The loose cement was removed. The knee was placed through a full range of motion patient had full extension flexion to 140 and had midline tracking the patella. The retinaculum was closed using #1 Vicryl subcutaneous was closed using 0 Vicryl the skin was closed using approximate staples. Wound is covered with Adaptic orthopedic sponges ABDs dressing web roll and Coban. Patient was extubated taken the PACU in stable condition plan for for discharge to home once he is safe in ambulation.  PLAN OF CARE: Admit to inpatient   PATIENT DISPOSITION:  PACU - hemodynamically stable.  Nadara Mustard, MD 09/12/2011 11:16 AM

## 2011-09-12 NOTE — Preoperative (Signed)
Beta Blockers   Reason not to administer Beta Blockers:Not Applicable 

## 2011-09-13 ENCOUNTER — Encounter (HOSPITAL_COMMUNITY): Payer: Self-pay | Admitting: Orthopedic Surgery

## 2011-09-13 LAB — BASIC METABOLIC PANEL
Chloride: 100 mEq/L (ref 96–112)
GFR calc Af Amer: >90 mL/min (ref >90)
GFR calc non Af Amer: 89 mL/min — ABNORMAL LOW (ref >90)
Potassium: 4 mEq/L (ref 3.5–5.1)
Sodium: 136 mEq/L (ref 135–145)

## 2011-09-13 LAB — PROTIME-INR
INR: 1.15 (ref 0.00–1.49)
Prothrombin Time: 14.9 seconds (ref 11.6–15.2)

## 2011-09-13 LAB — CBC
HCT: 35.5 % — ABNORMAL LOW (ref 39.0–52.0)
Hemoglobin: 12.1 g/dL — ABNORMAL LOW (ref 13.0–17.0)
WBC: 6.8 10*3/uL (ref 4.0–10.5)

## 2011-09-13 MED ORDER — MORPHINE SULFATE (PF) 1 MG/ML IV SOLN
INTRAVENOUS | Status: AC
  Filled 2011-09-13: qty 25

## 2011-09-13 MED ORDER — PROMETHAZINE HCL 25 MG/ML IJ SOLN
12.5000 mg | Freq: Four times a day (QID) | INTRAMUSCULAR | Status: DC | PRN
  Filled 2011-09-13: qty 1

## 2011-09-13 MED ORDER — WARFARIN SODIUM 6 MG PO TABS
6.0000 mg | ORAL_TABLET | Freq: Once | ORAL | Status: AC
  Administered 2011-09-13: 6 mg via ORAL
  Filled 2011-09-13: qty 1

## 2011-09-13 MED ORDER — LACTATED RINGERS IV SOLN
INTRAVENOUS | Status: DC
  Administered 2011-09-13: 22:00:00 via INTRAVENOUS
  Administered 2011-09-13: 1000 mL via INTRAVENOUS

## 2011-09-13 MED ORDER — WARFARIN SODIUM 7.5 MG PO TABS
7.5000 mg | ORAL_TABLET | Freq: Once | ORAL | Status: DC
  Filled 2011-09-13: qty 1

## 2011-09-13 NOTE — Discharge Instructions (Signed)
Home Health to be provided by [facility] [phone]

## 2011-09-13 NOTE — Progress Notes (Signed)
CARE MANAGEMENT NOTE 09/13/2011    Action/Plan:   Spoke with patient and wife. Preoperatively setup with [facility]- no changes.Pt states he has a rolling walker and doesnt need a 3in1.   Anticipated DC Date:  09/14/2011   Anticipated DC Plan:  HOME W HOME HEALTH SERVICES      DC Planning Services  CM consult      [facility] Choice  HOME HEALTH   Choice offered to / List presented to:  C-1 Patient      DME agency  NA     HH arranged  HH-2 PT      [facility] agency  [facility]   Status of service:  Completed, signed off  Discharge Disposition:  HOME W HOME HEALTH SERVICES

## 2011-09-13 NOTE — Progress Notes (Signed)
Patient ID: Christopher Potts, male   DOB: [birth year], 66 y.o.   MRN: [id number] Patient is postoperative day 1 status post revision total knee arthroplasty on the left with removal of deep retained hardware and placement of antibiotic beads. Patient has been having nausea and vomiting secondary to his narcotic pain medication. We will discontinue his PCA this morning. Prescription provided for Phenergan. He will work on knee extension exercises for the left knee and we will change the dressing today. Physical therapy progressive ambulation weightbearing as tolerated on the left. Hemoglobin stable this morning.

## 2011-09-13 NOTE — Progress Notes (Signed)
Physical Therapy Treatment Patient Details Name: Christopher Potts MRN: [id number] DOB: Mar 07, 1946 Today's Date: 09/13/2011  PT Assessment/Plan  PT - Assessment/Plan Comments on Treatment Session: Pt with improved activity tolerance vs morning session.  Able to progress to gait training.  Limited by quad and DF weakness on L.  Slight impulsiveness noted on sit to stand but pt follows commands and remained safe during gait training. PT Plan: Discharge plan remains appropriate PT Frequency: 7X/week Recommendations for Other Services: OT consult Follow Up Recommendations: Outpatient PT Equipment Recommended:  (pt. may benefit from 3-in-1; will defer to OT) PT Goals  Acute Rehab PT Goals PT Goal Formulation: With patient Time For Goal Achievement: 7 days Pt will go Supine/Side to Sit: with modified independence;with HOB 0 degrees PT Goal: Supine/Side to Sit - Progress: Progressing toward goal Pt will go Sit to Supine/Side: with modified independence;with HOB 0 degrees PT Goal: Sit to Supine/Side - Progress: Progressing toward goal Pt will go Sit to Stand: with modified independence PT Goal: Sit to Stand - Progress: Progressing toward goal Pt will go Stand to Sit: with modified independence PT Goal: Stand to Sit - Progress: Progressing toward goal Pt will Ambulate: 51 - 150 feet;with modified independence;with rolling walker PT Goal: Ambulate - Progress: Progressing toward goal Pt will Perform Home Exercise Program: with supervision, verbal cues required/provided PT Goal: Perform Home Exercise Program - Progress: Progressing toward goal  PT Treatment Precautions/Restrictions  Precautions Precautions: Knee Restrictions Weight Bearing Restrictions: Yes LLE Weight Bearing: Weight bearing as tolerated Mobility (including Balance) Bed Mobility Bed Mobility: Yes Supine to Sit: 5: Supervision Supine to Sit Details (indicate cue type and reason): HOB elevated, uses  rails Transfers Transfers: Yes Sit to Stand: 5: Supervision Sit to Stand Details (indicate cue type and reason): cues for safe hand placement Stand to Sit: 5: Supervision Stand to Sit Details: cues for hand and leg placement Ambulation/Gait Ambulation/Gait: Yes Ambulation/Gait Assistance: 4: Min assist Ambulation/Gait Assistance Details (indicate cue type and reason): Pt with decreased to up on L, limited by DF weakness. Pt initially with step to pattern, was able to progress to step through pattern with cuing.  good posture and safe use of RW.  Decreased cadence Ambulation Distance (Feet): 125 Feet Assistive device: Rolling walker Stairs: No    Exercise  Total Joint Exercises Ankle Circles/Pumps: AAROM;10 reps;Supine;Both Quad Sets: AROM;Left;10 reps;Supine Short Arc Quad: AAROM;Left;Supine Heel Slides: AROM;Left;Supine Hip ABduction/ADduction: AROM;Left;5 reps (only 5 reps due to increased pain in knee with abduction) Straight Leg Raises:  (attempted but pt unable) Knee Flexion: AAROM;Left;5 reps;Seated;Other (comment) (AA ROM -7 to 80 degrees) End of Session PT - End of Session Equipment Utilized During Treatment: Gait belt Activity Tolerance: Patient tolerated treatment well Patient left: in bed;with call bell in reach;with family/visitor present Nurse Communication: Mobility status for transfers;Weight bearing status General Behavior During Session: [facility] for tasks performed Cognition: [facility] for tasks performed  Laprecious Austill 09/13/2011, 2:15 PM

## 2011-09-13 NOTE — Progress Notes (Signed)
PT EVALUATION  09/13/11 1050  PT Visit Information  Last PT Received On 09/13/11  Patient Stated Goals  Goal #1 painfree and return to qork  Precautions  Precautions Knee  Restrictions  Weight Bearing Restrictions Yes  LLE Weight Bearing WBAT  Home Living  Lives With Spouse  Type of Home House  Home Layout One level  Home Access Stairs to enter  Entrance Stairs-Rails Can reach both;Right;Left  Entrance Stairs-Number of Steps 3  Bathroom Shower/Tub Tub/shower unit;Curtain  Horticulturist, commercial Yes  How Accessible Accessible via walker  Home Adaptive Equipment Tub transfer bench;Walker - rolling;Straight cane  Prior Function  Level of Independence Requires assistive device for independence;Independent with basic ADLs;Independent with gait;Independent with transfers  Able to Take Stairs? Yes  Driving Yes  Vocation On disability  Cognition  Arousal/Alertness Awake/alert  Overall Cognitive Status Appears within functional limits for tasks assessed  Orientation Level Oriented X4  Sensation  Light Touch Impaired by gross assessment (left leg is "dead" per pt. from original injury)  Bed Mobility  Bed Mobility Yes  Supine to Sit 4: Min assist;HOB elevated (Comment degrees)  Supine to Sit Details (indicate cue type and reason) pt. used both hands to move L LE toward EOB; safety cues  Transfers  Transfers Yes  Sit to Stand 4: Min assist;From bed;With upper extremity assist  Sit to Stand Details (indicate cue type and reason) vc's for safety and correct technique; pt. impulsive and unsafe to stand  Stand to Sit 4: Min assist;To chair/3-in-1;With upper extremity assist;With armrests  Stand to Sit Details vc's for safe technique and hand /leg placement  Ambulation/Gait  Ambulation/Gait No  Stairs No  RUE Assessment  RUE Assessment WFL  LUE Assessment  LUE Assessment WFL  RLE Assessment  RLE Assessment WFL  LLE Assessment  LLE Assessment X (fair  quad set and incomplete ankle pump (50%))  Exercises  Exercises Total Joint  Total Joint Exercises  Ankle Circles/Pumps AROM;AAROM;Both;10 reps;Supine (pt. used right LE to assist DF of left foot)  Quad Sets AROM;Left;10 reps;Supine  Knee Flexion AAROM;Left;5 reps;Seated;Other (comment) (AA ROM -7 to 80 degrees)  PT - End of Session  Equipment Utilized During Treatment Gait belt  Activity Tolerance Patient tolerated treatment well;Treatment limited secondary to medical complications (Comment);Other (comment) (pt. reports frequent N.V this am, limited activity to avoid )  Patient left in chair;with call bell in reach;with family/visitor present  Nurse Communication Mobility status for transfers;Weight bearing status  General  Behavior During Session [facility] for tasks performed  Cognition [facility] for tasks performed  PT Assessment  Clinical Impression Statement Pt. is s/p TKR revision wioth antibiotic beads and hardware removal from prior ORIF.  PT. limited in activity due to recent N/V buat expect he will progress well once N/V under better control.  Will need acute PT to address decreased fumctional mobility, ROM, strength and gait.  PT Recommendation/Assessment Patient will need skilled PT in the acute care venue  PT Problem List Decreased strength;Decreased range of motion;Decreased activity tolerance;Decreased mobility;Decreased knowledge of use of DME;Decreased safety awareness;Decreased knowledge of precautions;Pain  PT Therapy Diagnosis  Difficulty walking;Acute pain  PT Plan  PT Frequency 7X/week  PT Treatment/Interventions DME instruction;Gait training;Stair training;Functional mobility training;Therapeutic activities;Therapeutic exercise;Patient/family education  PT Recommendation  Recommendations for Other Services OT consult  Follow Up Recommendations Home health PT  Equipment Recommended (pt. may benefit from 3-in-1; will defer to OT)  Individuals Consulted  Consulted and Agree  with Results and Recommendations  Patient  Acute Rehab PT Goals  PT Goal Formulation With patient  Time For Goal Achievement 7 days  Pt will go Supine/Side to Sit with modified independence;with HOB 0 degrees  PT Goal: Supine/Side to Sit - Progress Goal set today  Pt will go Sit to Supine/Side with modified independence;with HOB 0 degrees  PT Goal: Sit to Supine/Side - Progress Goal set today  Pt will go Sit to Stand with modified independence  PT Goal: Sit to Stand - Progress Goal set today  Pt will go Stand to Sit with modified independence  PT Goal: Stand to Sit - Progress Goal set today  Pt will Ambulate 51 - 150 feet;with modified independence;with rolling walker  PT Goal: Ambulate - Progress Goal set today  Pt will Perform Home Exercise Program with supervision, verbal cues required/provided  PT Goal: Perform Home Exercise Program - Progress Goal set today   Weldon Picking PT Acute Rehab Services [phone] Beeper [phone]

## 2011-09-13 NOTE — Progress Notes (Addendum)
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 Fish Allergy Swelling    When eat fish and strawberries together but can eat separately   . Strawberry     When eaten with Fish, but can have separately     Patient Measurements:   Wt 95.1 kg Ht 73 inches  Vital Signs: Temp: 98 F (36.7 C) (03/22 0533) BP: 116/49 mmHg (03/22 0533) Pulse Rate: 71  (03/22 0533)  Labs:  Basename 09/13/11 0557  HGB 12.1*  HCT 35.5*  PLT 134*  APTT --  LABPROT 14.9  INR 1.15  HEPARINUNFRC --  CREATININE 0.85  CKTOTAL --  CKMB --  TROPONINI --   CrCl is unknown because there is no height on file for the current visit.  Medical History: Past Medical History  Diagnosis Date  . PONV (postoperative nausea and vomiting)     hx but no problem last surgery  . Heart murmur   . Blood transfusion   . GERD (gastroesophageal reflux disease)     Medications:  Prescriptions prior to admission  Medication Sig Dispense Refill  . acetaminophen (TYLENOL) 500 MG tablet Take 500 mg by mouth every 6 (six) hours as needed. For migraines      . ranitidine (ZANTAC) 75 MG tablet Take 75 mg by mouth daily as needed. For acid reflux        Assessment: 66 y.o. male on coumadin for VTE prophylaxis s/p L TKA. INR 1.15 today  Goal of Therapy:  INR 2-3   Plan:  1. Coumadin 7.5mg  po today 2. Daily INR  Talbert Cage, PharmD Clinical pharmacist, pager [phone] 09/13/2011,9:45 AM   Addum:  After reviewing previous dosing of coumadin will change dose today to 6 mg.

## 2011-09-14 LAB — BASIC METABOLIC PANEL
CO2: UNDETERMINED mEq/L (ref 19–32)
CO2: 27 mEq/L (ref 19–32)
Calcium: UNDETERMINED mg/dL (ref 8.4–10.5)
Chloride: UNDETERMINED mEq/L (ref 96–112)
Chloride: 102 mEq/L (ref 96–112)
Creatinine, Ser: 0.88 mg/dL (ref 0.50–1.35)
GFR calc Af Amer: UNDETERMINED mL/min (ref >90)
GFR calc Af Amer: >90 mL/min (ref >90)
Potassium: 3.5 mEq/L (ref 3.5–5.1)
Sodium: UNDETERMINED mEq/L (ref 135–145)

## 2011-09-14 LAB — CBC
HCT: 30.2 % — ABNORMAL LOW (ref 39.0–52.0)
MCV: 86.8 fL (ref 78.0–100.0)
Platelets: 120 10*3/uL — ABNORMAL LOW (ref 150–400)
RBC: 3.48 MIL/uL — ABNORMAL LOW (ref 4.22–5.81)
RDW: 14.4 % (ref 11.5–15.5)
WBC: 5.1 10*3/uL (ref 4.0–10.5)

## 2011-09-14 LAB — PROTIME-INR
INR: 1.61 — ABNORMAL HIGH (ref 0.00–1.49)
Prothrombin Time: 19.4 seconds — ABNORMAL HIGH (ref 11.6–15.2)

## 2011-09-14 MED ORDER — SENNA 8.6 MG PO TABS
1.0000 | ORAL_TABLET | Freq: Every day | ORAL | Status: DC | PRN
  Administered 2011-09-14: 8.6 mg via ORAL
  Filled 2011-09-14: qty 1

## 2011-09-14 MED ORDER — POLYETHYLENE GLYCOL 3350 17 G PO PACK
17.0000 g | PACK | Freq: Every day | ORAL | Status: DC
  Administered 2011-09-14: 17 g via ORAL
  Filled 2011-09-14 (×2): qty 1

## 2011-09-14 MED ORDER — WARFARIN SODIUM 2.5 MG PO TABS
2.5000 mg | ORAL_TABLET | Freq: Once | ORAL | Status: AC
  Administered 2011-09-14: 2.5 mg via ORAL
  Filled 2011-09-14: qty 1

## 2011-09-14 NOTE — Progress Notes (Signed)
Occupational Therapy Note  OT order received and appreciated.  Pt is able to perform BADLs and functional transfers at min assist/supervision level.  Pt with no DME needs and has necessary level of assistance upon d/c home.  No acute OT services needed.  Thank you!  09/14/2011 Cipriano Mile OTR/L Pager [phone] Office [phone]

## 2011-09-14 NOTE — Progress Notes (Signed)
Physical Therapy Progress Note 09/14/11 1112  PT Visit Information  Last PT Received On 09/14/11  Precautions  Precautions Knee  Restrictions  Weight Bearing Restrictions Yes  LLE Weight Bearing WBAT  Bed Mobility  Bed Mobility Yes  Supine to Sit 5: Supervision  Supine to Sit Details (indicate cue type and reason) pt. used belt to assist L LE over to EOB. Moderate use of rail.  Transfers  Transfers Yes  Sit to Stand 5: Supervision;With upper extremity assist;From bed  Sit to Stand Details (indicate cue type and reason) pt. impulsive with standing, cues to ensure placement of walker in front of patient before standing.   Stand to Sit 5: Supervision;With upper extremity assist;To bed  Stand to Sit Details Pt. with good control of descent. Demonstrated correct hand placement.   Ambulation/Gait  Ambulation/Gait Yes  Ambulation/Gait Assistance (Min guard (A))  Ambulation/Gait Assistance Details (indicate cue type and reason) Patient ambulated ~150 feet this session. Pt. Pushing RW far out in front of body causing exaggerated step length with the left at times. Pt also tends to step to close to front of RW causing forward flexion over front of RW.  Cues for safe steppage & advancement of RW.  pt. resistant to change gait pattern stating "i've been using walker for over a year". Educated pt on safe technique with RW. Required 3 standing rest breaks.  Ambulation Distance (Feet) 150 Feet  Assistive device Rolling walker  Gait Pattern Step-through pattern;Decreased stance time - left;Decreased dorsiflexion - left  Exercises  Exercises Total Joint  Total Joint Exercises  Long Arc Quad AAROM;Strengthening;Left;10 reps;Seated L Ankle dorsiflexion: AAROM; strengthening, 10 reps, seated,  PT - End of Session  Equipment Utilized During Treatment Gait belt  Activity Tolerance Patient tolerated treatment well  Patient left in bed;with call bell in reach;with family/visitor present  General    Behavior During Session [facility] for tasks performed  Cognition [facility] for tasks performed  PT - Assessment/Plan  Comments on Treatment Session Pt. ambulated 150 feet this session, needing cues for safety with RW, pt. is resistant to change gait pattern, "I've been using walker for over a year". Patient reports he had been using a belt for AAROM L dorsiflexion in room. Patient is concerned about limited dorsiflexion.   PT Plan Discharge plan remains appropriate  PT Frequency 7X/week  Follow Up Recommendations Home health PT  Acute Rehab PT Goals  PT Goal Formulation With patient  Time For Goal Achievement 7 days  PT Goal: Supine/Side to Sit - Progress Progressing toward goal  PT Goal: Sit to Stand - Progress Progressing toward goal  PT Goal: Stand to Sit - Progress Progressing toward goal  PT Goal: Ambulate - Progress Not met     Josepha Pigg 09/14/2011    Verdell Face, PTA [phone] 09/14/2011

## 2011-09-14 NOTE — Progress Notes (Signed)
PT TREATMENT NOTE  09/14/11 1113  PT Visit Information  Last PT Received On 09/14/11  Precautions  Precautions Knee  Restrictions  Weight Bearing Restrictions Yes  LLE Weight Bearing WBAT  Bed Mobility  Bed Mobility Yes  Supine to Sit 5: Supervision;Other (comment) (pt. using men's leather belt to move own LE)  Supine to Sit Details (indicate cue type and reason) safety cues  Transfers  Transfers Yes  Sit to Stand 5: Supervision  Sit to Stand Details (indicate cue type and reason) cues for safety and technique  Stand to Sit 5: Supervision  Stand to Sit Details cues for safety and technique  Ambulation/Gait  Ambulation/Gait Yes  Ambulation/Gait Assistance 4: Min assist  Ambulation/Gait Assistance Details (indicate cue type and reason) cues for sequence and safety  Ambulation Distance (Feet) 50 Feet  Assistive device Rolling walker  Gait Pattern Decreased dorsiflexion - left  Gait velocity slowed  Stairs No  Exercises  Exercises Total Joint  Total Joint Exercises  Ankle Circles/Pumps AAROM;10 reps;Supine;Both  Quad Sets AROM;Left;10 reps;Supine  Short Arc Quad AAROM;Left;Supine  Heel Slides AROM;Left;Supine  Hip ABduction/ADduction AROM;Left;5 reps  Knee Flexion AAROM;Left;5 reps;Seated;Other (comment) (AA ROM 0-90)  PT - End of Session  Equipment Utilized During Treatment Gait belt  Activity Tolerance Patient tolerated treatment well;Treatment limited secondary to medical complications (Comment);Other (comment) (mild nausea this am limited distance)  Patient left in chair;with call bell in reach;with family/visitor present  Nurse Communication Mobility status for transfers;Weight bearing status  General  Behavior During Session [facility] for tasks performed  Cognition [facility] for tasks performed  PT - Assessment/Plan  Comments on Treatment Session Pt. easily irritated by IV, gown, etc.  Less distance this am due to nausea.  Overall improving.  PT Plan Discharge plan remains  appropriate  PT Frequency 7X/week  Follow Up Recommendations Home health PT  Equipment Recommended Other (comment) (3-in-1 if OT agreeable)  Acute Rehab PT Goals  PT Goal: Supine/Side to Sit - Progress Progressing toward goal  PT Goal: Sit to Stand - Progress Progressing toward goal  PT Goal: Stand to Sit - Progress Progressing toward goal  PT Goal: Ambulate - Progress Progressing toward goal  PT Goal: Perform Home Exercise Program - Progress Progressing toward goal   Weldon Picking PT Acute Rehab Services [phone] Beeper [phone]

## 2011-09-14 NOTE — Progress Notes (Signed)
Pt stable vss l knee dressing ok hgb 10 - inr 1.6 Mobilize with PT Possible dc mon

## 2011-09-14 NOTE — Progress Notes (Signed)
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 Fish Allergy Swelling    When eat fish and strawberries together but can eat separately   . Strawberry     When eaten with Fish, but can have separately     Patient Measurements:   Wt 95.1 kg Ht 73 inches  Vital Signs: Temp: 99.2 F (37.3 C) (03/23 0502) BP: 101/54 mmHg (03/23 0502) Pulse Rate: 98  (03/23 0502)  Labs:  Basename 09/14/11 0920 09/14/11 0500 09/13/11 0557  HGB -- 10.2* 12.1*  HCT -- 30.2* 35.5*  PLT -- 120* 134*  APTT -- -- --  LABPROT -- 19.4* 14.9  INR -- 1.61* 1.15  HEPARINUNFRC -- -- --  CREATININE 0.88 SPECIMEN CONTAMINATED, UNABLE TO PERFORM TEST(S). 0.85  CKTOTAL -- -- --  CKMB -- -- --  TROPONINI -- -- --   CrCl is unknown because there is no height on file for the current visit.  Medical History: Past Medical History  Diagnosis Date  . PONV (postoperative nausea and vomiting)     hx but no problem last surgery  . Heart murmur   . Blood transfusion   . GERD (gastroesophageal reflux disease)     Medications:  Prescriptions prior to admission  Medication Sig Dispense Refill  . acetaminophen (TYLENOL) 500 MG tablet Take 500 mg by mouth every 6 (six) hours as needed. For migraines      . ranitidine (ZANTAC) 75 MG tablet Take 75 mg by mouth daily as needed. For acid reflux        Assessment: 66 y.o. male on coumadin for VTE prophylaxis s/p L TKA. INR 1.61 today  Goal of Therapy:  INR 2-3   Plan:  1. Coumadin 2.5 mg po today 2. Daily INR  Talbert Cage, PharmD Clinical pharmacist, pager [phone] 09/14/2011,1:52 PM

## 2011-09-15 LAB — CBC
HCT: 28.8 % — ABNORMAL LOW (ref 39.0–52.0)
Hemoglobin: 9.6 g/dL — ABNORMAL LOW (ref 13.0–17.0)
MCHC: 33.3 g/dL (ref 30.0–36.0)
RBC: 3.3 MIL/uL — ABNORMAL LOW (ref 4.22–5.81)
WBC: 4.7 10*3/uL (ref 4.0–10.5)

## 2011-09-15 LAB — BASIC METABOLIC PANEL
BUN: 7 mg/dL (ref 6–23)
Chloride: 101 mEq/L (ref 96–112)
GFR calc Af Amer: >90 mL/min (ref >90)
GFR calc non Af Amer: 89 mL/min — ABNORMAL LOW (ref >90)
Potassium: 3.5 mEq/L (ref 3.5–5.1)
Sodium: 137 mEq/L (ref 135–145)

## 2011-09-15 LAB — PROTIME-INR
INR: 1.9 — ABNORMAL HIGH (ref 0.00–1.49)
Prothrombin Time: 22.1 seconds — ABNORMAL HIGH (ref 11.6–15.2)

## 2011-09-15 MED ORDER — WARFARIN SODIUM 2.5 MG PO TABS
2.5000 mg | ORAL_TABLET | Freq: Once | ORAL | Status: AC
  Administered 2011-09-15: 2.5 mg via ORAL
  Filled 2011-09-15: qty 1

## 2011-09-15 MED ORDER — SENNA 8.6 MG PO TABS: 1.0000 | ORAL_TABLET | Freq: Every day | ORAL | Status: DC | PRN

## 2011-09-15 MED ORDER — HYDROCODONE-ACETAMINOPHEN 5-500 MG PO TABS: 1.0000 | ORAL_TABLET | Freq: Four times a day (QID) | ORAL | Status: DC | PRN

## 2011-09-15 MED ORDER — WARFARIN SODIUM 5 MG PO TABS: 5.0000 mg | ORAL_TABLET | Freq: Every day | ORAL | Status: DC

## 2011-09-15 MED ORDER — SENNA 8.6 MG PO TABS
2.0000 | ORAL_TABLET | Freq: Every day | ORAL | Status: DC | PRN
  Administered 2011-09-15: 17.2 mg via ORAL
  Filled 2011-09-15: qty 2

## 2011-09-15 MED ORDER — DOCUSATE SODIUM 100 MG PO CAPS
100.0000 mg | ORAL_CAPSULE | Freq: Two times a day (BID) | ORAL | Status: DC
  Administered 2011-09-15 – 2011-09-16 (×2): 100 mg via ORAL
  Filled 2011-09-15 (×3): qty 1

## 2011-09-15 NOTE — Progress Notes (Signed)
Called to pt's room with concern about a "sound" in the pt's knee. Pt pressed on area above the knee while bent, knee made a "squishing" sound with obvious fluid shift to lower knee. Knee with obvious edema, warm to touch. Pt concerned. Wished to stay overnight to allow MD to evaluate in am. Pt denies excessive pain, has more than adequate ROM. MD on call notified- Ok'ed cancelling discharge until Dr Lajoyce Corners can evaluate swelling. Dressing changed, compression wrap applied from toe to thigh. Ice packs applied, leg elevated. Will continue to monitor.

## 2011-09-15 NOTE — Progress Notes (Signed)
Physical Therapy Treatment Patient Details Name: Christopher Potts MRN: [id number] DOB: 1946/02/14 Today's Date: 09/15/2011  PT Assessment/Plan  PT - Assessment/Plan Comments on Treatment Session: Increased gait distance, less assistance needed today and stair education completed. Pt is to discharge home with spouse assist later today. PT Plan: Discharge plan remains appropriate;Frequency remains appropriate PT Frequency: 7X/week Follow Up Recommendations: Home health PT Equipment Recommended: None recommended by PT PT Goals  Acute Rehab PT Goals PT Goal: Supine/Side to Sit - Progress: Met PT Goal: Sit to Stand - Progress: Met PT Goal: Stand to Sit - Progress: Met PT Goal: Ambulate - Progress: Met PT Goal: Perform Home Exercise Program - Progress: Progressing toward goal  PT Treatment Precautions/Restrictions  Precautions Precautions: Knee Restrictions Weight Bearing Restrictions: Yes LLE Weight Bearing: Weight bearing as tolerated Mobility (including Balance) Bed Mobility Supine to Sit: 6: Modified independent (Device/Increase time) Supine to Sit Details (indicate cue type and reason): no cues or assistance needed with any bed mobility Transfers Sit to Stand: 6: Modified independent (Device/Increase time) Sit to Stand Details (indicate cue type and reason): no cues or assistance needed Stand to Sit: 6: Modified independent (Device/Increase time) Stand to Sit Details: no cues or assistance needed Ambulation/Gait Ambulation/Gait Assistance: 5: Supervision;6: Modified independent (Device/Increase time) Ambulation/Gait Assistance Details (indicate cue type and reason): cues for RW safety with gait (not to get too close to RW front). no cues with bil axillary crutches. Ambulation Distance (Feet): 180 Feet (51ft with RW; 87ft with axl crutches) Assistive device: Rolling walker;Crutches Gait Pattern: Step-through pattern;Antalgic;Decreased stance time - left Gait velocity: increased-  cues to slow down for safety Stairs: Yes Stairs Assistance: 6: Modified independent (Device/Increase time) Stair Management Technique: Two rails;Step to pattern Number of Stairs: 5  (5 x2 trials)  Posture/Postural Control Posture/Postural Control: No significant limitations Exercise  Total Joint Exercises Knee Flexion: AROM;5 reps;Left;Seated (AROM 4-110 seated) End of Session PT - End of Session Equipment Utilized During Treatment: Gait belt Activity Tolerance: Patient tolerated treatment well Patient left: in bed;with call bell in reach;with family/visitor present (seated edge of bed) Nurse Communication: Mobility status for transfers;Mobility status for ambulation;Weight bearing status General Behavior During Session: [facility] for tasks performed Cognition: [facility] for tasks performed  Sallyanne Kuster 09/15/2011, 12:46 PM  Sallyanne Kuster, PTA Office- [phone] Pager- [phone]

## 2011-09-15 NOTE — Progress Notes (Signed)
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 Fish Allergy Swelling    When eat fish and strawberries together but can eat separately   . Strawberry     When eaten with Fish, but can have separately     Patient Measurements:   Wt 95.1 kg Ht 73 inches  Vital Signs: Temp: 97.9 F (36.6 C) (03/24 1509) Temp src: Oral (03/24 1509) BP: 99/49 mmHg (03/24 1509) Pulse Rate: 70  (03/24 1509)  Labs:  Basename 09/15/11 0600 09/14/11 0920 09/14/11 0500 09/13/11 0557  HGB 9.6* -- 10.2* --  HCT 28.8* -- 30.2* 35.5*  PLT 118* -- 120* 134*  APTT -- -- -- --  LABPROT 22.1* -- 19.4* 14.9  INR 1.90* -- 1.61* 1.15  HEPARINUNFRC -- -- -- --  CREATININE 0.87 0.88 SPECIMEN CONTAMINATED, UNABLE TO PERFORM TEST(S). --  CKTOTAL -- -- -- --  CKMB -- -- -- --  TROPONINI -- -- -- --   CrCl is unknown because there is no height on file for the current visit.  Medical History: Past Medical History  Diagnosis Date  . PONV (postoperative nausea and vomiting)     hx but no problem last surgery  . Heart murmur   . Blood transfusion   . GERD (gastroesophageal reflux disease)     Medications:  Prescriptions prior to admission  Medication Sig Dispense Refill  . ranitidine (ZANTAC) 75 MG tablet Take 75 mg by mouth daily as needed. For acid reflux      . DISCONTD: acetaminophen (TYLENOL) 500 MG tablet Take 500 mg by mouth every 6 (six) hours as needed. For migraines        Assessment: 66 y.o. male on coumadin for VTE prophylaxis s/p L TKA. INR increasing appropriately towards goal.  No complications noted.  Goal of Therapy:  INR 2-3   Plan:  1. Coumadin 2.5 mg po today 2. Daily INR   Amanuel Sinkfield L. Illene Bolus, PharmD, BCPS Clinical Pharmacist Pager: [phone] 09/15/2011 4:59 PM

## 2011-09-15 NOTE — Progress Notes (Signed)
Pt stable vss Dressing dry Plan dc today - mobilizing well with PT

## 2011-09-16 LAB — PROTIME-INR
INR: 1.84 — ABNORMAL HIGH (ref 0.00–1.49)
Prothrombin Time: 21.6 seconds — ABNORMAL HIGH (ref 11.6–15.2)

## 2011-09-16 MED ORDER — HYDROCODONE-ACETAMINOPHEN 5-500 MG PO TABS: 1.0000 | ORAL_TABLET | Freq: Four times a day (QID) | ORAL | Status: AC | PRN

## 2011-09-16 MED ORDER — TRAMADOL HCL 50 MG PO TABS: 50.0000 mg | ORAL_TABLET | Freq: Four times a day (QID) | ORAL | Status: AC | PRN

## 2011-09-16 MED ORDER — WARFARIN SODIUM 1 MG PO TABS: 1.0000 mg | ORAL_TABLET | Freq: Every day | ORAL | Status: DC

## 2011-09-16 NOTE — Progress Notes (Signed)
Physical Therapy Treatment Note   09/16/11 1011  PT Visit Information  Last PT Received On 09/16/11  Precautions  Precautions Knee  Restrictions  LLE Weight Bearing WBAT  Bed Mobility  Supine to Sit 6: Modified independent (Device/Increase time)  Transfers  Sit to Stand 6: Modified independent (Device/Increase time)  Stand to Sit 6: Modified independent (Device/Increase time)  Ambulation/Gait  Ambulation/Gait Assistance 5: Supervision  Ambulation Distance (Feet) 200 Feet  Assistive device Crutches  Gait Pattern Step-to pattern;Antalgic  Gait velocity WFL surgery  Stairs Yes  Stairs Assistance 5: Supervision  Stairs Assistance Details (indicate cue type and reason) pt demonstrates safe stair negotiation with bilat hand rails  Stair Management Technique Two rails  Number of Stairs 5   Total Joint Exercises  Ankle Circles/Pumps AROM;10 reps;Both (limited on L, able to wiggle toes but unable to DF)  Quad Sets AROM;Left;10 reps;Supine (with 5 second hold)  Short Arc Quad (unable to complete)  Knee Flexion AROM;20 reps;Seated;Left  Long [employer] (unable to complete)  PT - End of Session  Activity Tolerance Patient tolerated treatment well  Patient left in bed;with call bell in reach  General  Behavior During Session [facility] for tasks performed  Cognition [facility] for tasks performed  PT - Assessment/Plan  Comments on Treatment Session Patient con't to have inability to complete SAQ or LAQ however has a  good quad set. Patient reports MD suspects tendon/ligamentous injury due to patient flexing his L knee to much.  PT Plan Discharge plan remains appropriate - patient to be d/c'd home today  Follow Up Recommendations Home health PT  Acute Rehab PT Goals  PT Goal: Supine/Side to Sit - Progress Met  PT Goal: Sit to Supine/Side - Progress Met  PT Goal: Sit to Stand - Progress Met  PT Goal: Stand to Sit - Progress Met  PT Goal: Ambulate - Progress Met  PT Goal: Perform Home Exercise  Program - Progress Met    Pain: 2/10 L knee pain  Lewis Shock, PT, DPT Pager #: [phone] Office #: [phone]

## 2011-09-16 NOTE — Discharge Summary (Signed)
Physician Discharge Summary  Patient ID: Christopher Potts MRN: [id number] DOB/AGE: 66-26-1947 66 y.o.  Admit date: 09/12/2011 Discharge date: 09/16/2011  Admission Diagnoses: Tibial plateau fracture left knee status post open reduction internal fixation  Discharge Diagnoses: Same Active Problems:  * No active hospital problems. *    Discharged Condition: stable  Hospital Course: Patient's hospital course was essentially unremarkable he underwent revision total knee with removal of internal fixation and placement of a hinged total knee arthroplasty. Patient progressed well with physical therapy and was discharged to home in stable condition.  Consults: None  Significant Diagnostic Studies: labs: Routine labs  Treatments: surgery: Removal of deep retained hardware tibial plateau fracture with conversion to ReVision total knee arthroplasty with a hinged total knee arthroplasty using Zimmer components  Discharge Exam: Blood pressure 96/58, pulse 73, temperature 98 F (36.7 C), temperature source Oral, resp. rate 16, SpO2 97.00%. Incision/Wound: at discharge patient's incision was clean and dry. Patient had no extensor lag immediately postoperatively but he does have an extensor lag at this time. There is a knee effusion but no redness no cellulitis. He has active extension of the lesser toes but does not have active extension of the great toe or ankle. He does have good sensation in the deep and superficial peroneal nerve distribution. His leg lengths are equal. 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 I am concerned with his extensor lag and patient may have disrupted the extensor mechanism. We'll reevaluate this in 1 week to see if his extensor lag improves. There is no palpable defect at this time.  Disposition: 03-Skilled Nursing Facility  Discharge Orders      Future Orders Please Complete By Expires   Diet - low sodium heart healthy      Call MD / Call 911      Comments:   If you experience chest pain or shortness of breath, CALL 911 and be transported to the hospital emergency room.  If you develope a fever above 101 F, pus (white drainage) or increased drainage or redness at the wound, or calf pain, call your surgeon's office.   Constipation Prevention      Comments:   Drink plenty of fluids.  Prune juice may be helpful.  You may use a stool softener, such as Colace (over the counter) 100 mg twice a day.  Use MiraLax (over the counter) for constipation as needed.   Increase activity slowly as tolerated      Weight Bearing as taught in Physical Therapy      Comments:   Use a walker or crutches as instructed.     Medication List  As of 09/16/2011  6:59 AM   STOP taking these medications         acetaminophen 500 MG tablet         TAKE these medications         HYDROcodone-acetaminophen 5-500 MG per tablet   Commonly known as: VICODIN   Take 1 tablet by mouth every 6 (six) hours as needed for pain.      ranitidine 75 MG tablet   Commonly known as: ZANTAC   Take 75 mg by mouth daily as needed. For acid reflux      senna 8.6 MG Tabs   Commonly known as: SENOKOT   Take 1 tablet (8.6 mg total) by mouth daily as needed.  warfarin 5 MG tablet   Commonly known as: COUMADIN   Take 1 tablet (5 mg total) by mouth daily.           Follow-up Information    Follow up with Shaylan Tutton V, MD in 1 week.   Contact information:   [address] [place] [address] [phone]          Signed: Nadara Mustard 09/16/2011, 6:59 AM

## 2011-09-24 ENCOUNTER — Other Ambulatory Visit (HOSPITAL_COMMUNITY): Payer: Self-pay | Admitting: *Deleted

## 2011-09-24 ENCOUNTER — Encounter (HOSPITAL_COMMUNITY): Payer: Self-pay | Admitting: *Deleted

## 2011-09-24 ENCOUNTER — Encounter (HOSPITAL_COMMUNITY): Payer: Self-pay | Admitting: Respiratory Therapy

## 2011-09-24 NOTE — Progress Notes (Signed)
Pt questioned time of OR.  Is spoke with Corrie Dandy -OR scheduler and she confirmed that it is for 1326 on Fri.  I spoke with Chery at Dr Audrie Lia office and she said OR  date and time was changed by Dr Lajoyce Corners.  Dr Lajoyce Corners felt that he would Fri would be abetter day for this surgery.  I notified Mr Vallecillo and also told him that Elnita Maxwell said will call him and explain.

## 2011-09-24 NOTE — Progress Notes (Signed)
I instructed Christopher Potts to stop Coumadin and to not take NSAID or Herbal meds until; after surgery.

## 2011-09-27 ENCOUNTER — Other Ambulatory Visit (HOSPITAL_COMMUNITY): Payer: Self-pay | Admitting: Orthopedic Surgery

## 2011-09-27 ENCOUNTER — Ambulatory Visit (HOSPITAL_COMMUNITY): Payer: Medicare Other | Admitting: Certified Registered"

## 2011-09-27 ENCOUNTER — Encounter (HOSPITAL_COMMUNITY)
Admission: RE | Disposition: A | Discharge status: Home / Self Care | Payer: Self-pay | Source: Ambulatory Visit | Attending: Orthopedic Surgery

## 2011-09-27 ENCOUNTER — Encounter (HOSPITAL_COMMUNITY): Payer: Self-pay | Admitting: Certified Registered"

## 2011-09-27 ENCOUNTER — Inpatient Hospital Stay (HOSPITAL_COMMUNITY)
Admission: RE | Admit: 2011-09-27 | Discharge: 2011-09-28 | DRG: 502 | Disposition: A | Discharge status: Home / Self Care | Payer: Medicare Other | Source: Ambulatory Visit | Attending: Orthopedic Surgery | Admitting: Orthopedic Surgery

## 2011-09-27 ENCOUNTER — Encounter (HOSPITAL_COMMUNITY): Payer: Self-pay | Admitting: *Deleted

## 2011-09-27 DIAGNOSIS — Z7901 Long term (current) use of anticoagulants: Secondary | ICD-10-CM

## 2011-09-27 DIAGNOSIS — K219 Gastro-esophageal reflux disease without esophagitis: Secondary | ICD-10-CM | POA: Diagnosis present

## 2011-09-27 DIAGNOSIS — X500XXA Overexertion from strenuous movement or load, initial encounter: Secondary | ICD-10-CM | POA: Diagnosis present

## 2011-09-27 DIAGNOSIS — Z96659 Presence of unspecified artificial knee joint: Secondary | ICD-10-CM

## 2011-09-27 DIAGNOSIS — Z01812 Encounter for preprocedural laboratory examination: Secondary | ICD-10-CM

## 2011-09-27 DIAGNOSIS — Z01810 Encounter for preprocedural cardiovascular examination: Secondary | ICD-10-CM

## 2011-09-27 DIAGNOSIS — Z79899 Other long term (current) drug therapy: Secondary | ICD-10-CM

## 2011-09-27 DIAGNOSIS — S86819A Strain of other muscle(s) and tendon(s) at lower leg level, unspecified leg, initial encounter: Principal | ICD-10-CM | POA: Diagnosis present

## 2011-09-27 DIAGNOSIS — S838X9A Sprain of other specified parts of unspecified knee, initial encounter: Principal | ICD-10-CM | POA: Diagnosis present

## 2011-09-27 HISTORY — PX: PATELLAR TENDON REPAIR: SHX737

## 2011-09-27 LAB — COMPREHENSIVE METABOLIC PANEL
Albumin: 3.8 g/dL (ref 3.5–5.2)
Alkaline Phosphatase: 121 U/L — ABNORMAL HIGH (ref 39–117)
BUN: 13 mg/dL (ref 6–23)
Calcium: 9.5 mg/dL (ref 8.4–10.5)
Creatinine, Ser: 0.88 mg/dL (ref 0.50–1.35)
GFR calc Af Amer: >90 mL/min (ref >90)
Glucose, Bld: 116 mg/dL — ABNORMAL HIGH (ref 70–99)
Total Protein: 7.1 g/dL (ref 6.0–8.3)

## 2011-09-27 LAB — CBC
HCT: 37.2 % — ABNORMAL LOW (ref 39.0–52.0)
Hemoglobin: 12.1 g/dL — ABNORMAL LOW (ref 13.0–17.0)
MCH: 28.3 pg (ref 26.0–34.0)
MCHC: 32.5 g/dL (ref 30.0–36.0)
MCV: 86.9 fL (ref 78.0–100.0)
RDW: 15.2 % (ref 11.5–15.5)

## 2011-09-27 LAB — PROTIME-INR
INR: 1.06 (ref 0.00–1.49)
Prothrombin Time: 14 seconds (ref 11.6–15.2)

## 2011-09-27 SURGERY — REPAIR, TENDON, PATELLAR
Anesthesia: General | Site: Knee | Laterality: Left | Wound class: Clean

## 2011-09-27 MED ORDER — TEMAZEPAM 15 MG PO CAPS: 15.0000 mg | ORAL_CAPSULE | Freq: Every evening | ORAL | Status: DC | PRN

## 2011-09-27 MED ORDER — 0.9 % SODIUM CHLORIDE (POUR BTL) OPTIME
TOPICAL | Status: DC | PRN
  Administered 2011-09-27: 1000 mL

## 2011-09-27 MED ORDER — MIDAZOLAM HCL 5 MG/5ML IJ SOLN
INTRAMUSCULAR | Status: DC | PRN
  Administered 2011-09-27: 2 mg via INTRAVENOUS

## 2011-09-27 MED ORDER — FAMOTIDINE 10 MG PO TABS
10.0000 mg | ORAL_TABLET | Freq: Every day | ORAL | Status: DC
  Filled 2011-09-27: qty 1

## 2011-09-27 MED ORDER — WARFARIN SODIUM 1 MG PO TABS
1.0000 mg | ORAL_TABLET | Freq: Every day | ORAL | Status: DC
  Administered 2011-09-27: 1 mg via ORAL
  Filled 2011-09-27 (×2): qty 1

## 2011-09-27 MED ORDER — CEFAZOLIN SODIUM 1-5 GM-% IV SOLN
1.0000 g | Freq: Four times a day (QID) | INTRAVENOUS | Status: AC
  Administered 2011-09-27 – 2011-09-28 (×3): 1 g via INTRAVENOUS
  Filled 2011-09-27 (×3): qty 50

## 2011-09-27 MED ORDER — LACTATED RINGERS IV SOLN
INTRAVENOUS | Status: DC
  Administered 2011-09-27: 12:00:00 via INTRAVENOUS

## 2011-09-27 MED ORDER — ONDANSETRON HCL 4 MG/2ML IJ SOLN: 4.0000 mg | Freq: Once | INTRAMUSCULAR | Status: DC | PRN

## 2011-09-27 MED ORDER — ONDANSETRON HCL 4 MG/2ML IJ SOLN: 4.0000 mg | Freq: Four times a day (QID) | INTRAMUSCULAR | Status: DC | PRN

## 2011-09-27 MED ORDER — PROPOFOL 10 MG/ML IV EMUL
INTRAVENOUS | Status: DC | PRN
  Administered 2011-09-27: 180 mg via INTRAVENOUS

## 2011-09-27 MED ORDER — HYDROMORPHONE HCL PF 1 MG/ML IJ SOLN: 0.2500 mg | INTRAMUSCULAR | Status: DC | PRN

## 2011-09-27 MED ORDER — ONDANSETRON HCL 4 MG/2ML IJ SOLN
INTRAMUSCULAR | Status: DC | PRN
  Administered 2011-09-27 (×2): 4 mg via INTRAVENOUS

## 2011-09-27 MED ORDER — METOCLOPRAMIDE HCL 5 MG/ML IJ SOLN: 5.0000 mg | Freq: Three times a day (TID) | INTRAMUSCULAR | Status: DC | PRN

## 2011-09-27 MED ORDER — SENNA 8.6 MG PO TABS
1.0000 | ORAL_TABLET | Freq: Every day | ORAL | Status: DC | PRN
  Filled 2011-09-27: qty 1

## 2011-09-27 MED ORDER — DEXAMETHASONE SODIUM PHOSPHATE 4 MG/ML IJ SOLN
INTRAMUSCULAR | Status: DC | PRN
  Administered 2011-09-27: 4 mg via INTRAVENOUS

## 2011-09-27 MED ORDER — DROPERIDOL 2.5 MG/ML IJ SOLN
INTRAMUSCULAR | Status: DC | PRN
  Administered 2011-09-27: 0.625 mg via INTRAVENOUS

## 2011-09-27 MED ORDER — METHOCARBAMOL 500 MG PO TABS: 500.0000 mg | ORAL_TABLET | Freq: Four times a day (QID) | ORAL | Status: DC | PRN

## 2011-09-27 MED ORDER — CEFAZOLIN SODIUM-DEXTROSE 2-3 GM-% IV SOLR
2.0000 g | INTRAVENOUS | Status: AC
  Administered 2011-09-27: 2 g via INTRAVENOUS

## 2011-09-27 MED ORDER — HYDROCODONE-ACETAMINOPHEN 5-325 MG PO TABS
1.0000 | ORAL_TABLET | ORAL | Status: DC | PRN
  Administered 2011-09-28: 1 via ORAL
  Filled 2011-09-27: qty 2

## 2011-09-27 MED ORDER — ONDANSETRON HCL 4 MG PO TABS: 4.0000 mg | ORAL_TABLET | Freq: Four times a day (QID) | ORAL | Status: DC | PRN

## 2011-09-27 MED ORDER — HYDROMORPHONE HCL PF 1 MG/ML IJ SOLN
0.5000 mg | INTRAMUSCULAR | Status: DC | PRN
  Administered 2011-09-27: 0.5 mg via INTRAVENOUS
  Filled 2011-09-27: qty 1

## 2011-09-27 MED ORDER — VANCOMYCIN HCL 1000 MG IV SOLR
1000.0000 mg | INTRAVENOUS | Status: DC
  Filled 2011-09-27: qty 1000

## 2011-09-27 MED ORDER — OXYCODONE-ACETAMINOPHEN 5-325 MG PO TABS: 1.0000 | ORAL_TABLET | ORAL | Status: DC | PRN

## 2011-09-27 MED ORDER — FENTANYL CITRATE 0.05 MG/ML IJ SOLN
INTRAMUSCULAR | Status: DC | PRN
  Administered 2011-09-27 (×2): 100 ug via INTRAVENOUS
  Administered 2011-09-27: 50 ug via INTRAVENOUS

## 2011-09-27 MED ORDER — CEFAZOLIN SODIUM 1-5 GM-% IV SOLN: 1.0000 g | INTRAVENOUS | Status: DC

## 2011-09-27 MED ORDER — GENTAMICIN SULFATE 40 MG/ML IJ SOLN
240.0000 mg | INTRAMUSCULAR | Status: DC
  Filled 2011-09-27: qty 6

## 2011-09-27 MED ORDER — METOCLOPRAMIDE HCL 10 MG PO TABS: 5.0000 mg | ORAL_TABLET | Freq: Three times a day (TID) | ORAL | Status: DC | PRN

## 2011-09-27 MED ORDER — LIDOCAINE HCL (CARDIAC) 20 MG/ML IV SOLN
INTRAVENOUS | Status: DC | PRN
  Administered 2011-09-27: 100 mg via INTRAVENOUS

## 2011-09-27 MED ORDER — METHOCARBAMOL 100 MG/ML IJ SOLN
500.0000 mg | Freq: Four times a day (QID) | INTRAVENOUS | Status: DC | PRN
  Filled 2011-09-27: qty 5

## 2011-09-27 MED ORDER — CEFAZOLIN SODIUM-DEXTROSE 2-3 GM-% IV SOLR
INTRAVENOUS | Status: AC
  Filled 2011-09-27: qty 50

## 2011-09-27 MED ORDER — WARFARIN - PHYSICIAN DOSING INPATIENT: Freq: Every day | Status: DC

## 2011-09-27 MED ORDER — SODIUM CHLORIDE 0.9 % IV SOLN
INTRAVENOUS | Status: DC
  Administered 2011-09-27: 17:00:00 via INTRAVENOUS

## 2011-09-27 SURGICAL SUPPLY — 43 items
ANCHOR CORKSCREW 3.5 FIBERWIRE (Anchor) ×4 IMPLANT
BANDAGE GAUZE ELAST BULKY 4 IN (GAUZE/BANDAGES/DRESSINGS) ×2 IMPLANT
BLADE SURG 10 STRL SS (BLADE) ×2 IMPLANT
BNDG COHESIVE 6X5 TAN STRL LF (GAUZE/BANDAGES/DRESSINGS) ×2 IMPLANT
BNDG ESMARK 4X9 LF (GAUZE/BANDAGES/DRESSINGS) IMPLANT
BNDG GAUZE STRTCH 6 (GAUZE/BANDAGES/DRESSINGS) ×2 IMPLANT
CLOTH BEACON ORANGE TIMEOUT ST (SAFETY) ×2 IMPLANT
COTTON STERILE ROLL (GAUZE/BANDAGES/DRESSINGS) ×2 IMPLANT
CUFF TOURNIQUET SINGLE 34IN LL (TOURNIQUET CUFF) IMPLANT
CUFF TOURNIQUET SINGLE 44IN (TOURNIQUET CUFF) IMPLANT
DRAPE INCISE IOBAN 66X45 STRL (DRAPES) ×2 IMPLANT
DRAPE U-SHAPE 47X51 STRL (DRAPES) ×2 IMPLANT
DRSG ADAPTIC 3X8 NADH LF (GAUZE/BANDAGES/DRESSINGS) ×2 IMPLANT
DURAPREP 26ML APPLICATOR (WOUND CARE) ×2 IMPLANT
ELECT REM PT RETURN 9FT ADLT (ELECTROSURGICAL) ×2
ELECTRODE REM PT RTRN 9FT ADLT (ELECTROSURGICAL) ×1 IMPLANT
GLOVE BIOGEL PI IND STRL 9 (GLOVE) ×1 IMPLANT
GLOVE BIOGEL PI INDICATOR 9 (GLOVE) ×1
GLOVE SURG ORTHO 9.0 STRL STRW (GLOVE) ×2 IMPLANT
GOWN PREVENTION PLUS XLARGE (GOWN DISPOSABLE) ×2 IMPLANT
GOWN SRG XL XLNG 56XLVL 4 (GOWN DISPOSABLE) ×2 IMPLANT
GOWN STRL NON-REIN XL XLG LVL4 (GOWN DISPOSABLE) ×2
KIT ROOM TURNOVER OR (KITS) ×2 IMPLANT
KIT STIMULAN RAPID CURE  10CC (Orthopedic Implant) ×1 IMPLANT
KIT STIMULAN RAPID CURE 10CC (Orthopedic Implant) ×1 IMPLANT
MANIFOLD NEPTUNE II (INSTRUMENTS) ×2 IMPLANT
MATRIX SURGICAL PSM 7X10CM (Tissue) ×2 IMPLANT
NDL SUT .5 MAYO 1.404X.05X (NEEDLE) ×1 IMPLANT
NEEDLE MAYO TAPER (NEEDLE) ×1
NS IRRIG 1000ML POUR BTL (IV SOLUTION) ×2 IMPLANT
PACK ORTHO EXTREMITY (CUSTOM PROCEDURE TRAY) ×2 IMPLANT
PAD ARMBOARD 7.5X6 YLW CONV (MISCELLANEOUS) ×4 IMPLANT
SPONGE GAUZE 4X4 12PLY (GAUZE/BANDAGES/DRESSINGS) ×2 IMPLANT
SPONGE LAP 4X18 X RAY DECT (DISPOSABLE) ×4 IMPLANT
SUT ETHILON 3 0 FSLX (SUTURE) ×4 IMPLANT
SUT FIBERWIRE #2 38 T-5 BLUE (SUTURE) ×4
SUT MNCRL AB 3-0 PS2 18 (SUTURE) ×2 IMPLANT
SUTURE FIBERWR #2 38 T-5 BLUE (SUTURE) ×2 IMPLANT
TOWEL OR 17X24 6PK STRL BLUE (TOWEL DISPOSABLE) ×2 IMPLANT
TOWEL OR 17X26 10 PK STRL BLUE (TOWEL DISPOSABLE) ×2 IMPLANT
TUBE CONNECTING 12X1/4 (SUCTIONS) ×2 IMPLANT
WATER STERILE IRR 1000ML POUR (IV SOLUTION) ×2 IMPLANT
YANKAUER SUCT BULB TIP NO VENT (SUCTIONS) ×2 IMPLANT

## 2011-09-27 NOTE — Preoperative (Signed)
Beta Blockers   Reason not to administer Beta Blockers:Not Applicable 

## 2011-09-27 NOTE — Op Note (Signed)
OPERATIVE REPORT  DATE OF SURGERY: 09/27/2011  PATIENT:  Christopher Potts,  66 y.o. male  PRE-OPERATIVE DIAGNOSIS:  Left Patella Tendon Rupture  POST-OPERATIVE DIAGNOSIS:  Left Patella Tendon Rupture  PROCEDURE:  Procedure(s): PATELLA TENDON REPAIR Ace cell xenograft tissue augmentation. Stimulant antibiotic beads 10 cc with 1 g vancomycin and 240 mg gentamicin  SURGEON:  Surgeon(s): Nadara Mustard, MD  ANESTHESIA:   general  EBL:  min ML  SPECIMEN:  No Specimen  TOURNIQUET:  * No tourniquets in log *  PROCEDURE DETAILS: Patient is a 66 year old gentleman who presents status post revision hinged total knee arthroplasty on the left. Patient sustained a rupture of the patellar tendon postoperatively and presents at this time for surgical reconstruction. Risks and benefits were discussed including infection neurovascular injury persistent pain rerupture need for additional surgery. Patient states he understands and wishes to proceed at this time. Description of procedure patient was brought to OR room 10 and underwent a general anesthetic. After adequate levels of anesthesia were obtained patient's left lower extremity was prepped using DuraPrep draped into a sterile field an [place] was used to cover all exposed skin. The distal three quarters of the staples were removed. The incision was reopened. There was a hematoma but no signs of infection. The wound is irrigated with pulsatile lavage the hematoma was decompressed. The ends of the patella tendon were freshened. Using #2 FiberWire this was weaved in a Krakw suture technique with 2 fiber wires exiting the distal stump of the patellar tendon. 2 screw anchors were then placed into the tibial tubercle region. The 4 strands distally and 4 strands proximally were secured the tendon had a good reconstruction. The antibiotic beads were placed deep to the patellar tendon and superficial to the patella tendon. The patella tendon was reinforced with  the Ace cell xenograft tissue graft. The skin was closed using 2-0 nylon there was no tension the skin. The wound was covered with Adaptic orthopedic sponges Kerlix and Coban. Patient was placed in a knee immobilizer extubated taken to the PACU in stable condition.  PLAN OF CARE: Admit to inpatient   PATIENT DISPOSITION:  PACU - hemodynamically stable.   Nadara Mustard, MD 09/27/2011 2:14 PM
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
## 2011-09-27 NOTE — Anesthesia Preprocedure Evaluation (Signed)
Anesthesia Evaluation  Patient identified by MRN, date of birth, ID band  Reviewed: Allergy & Precautions, H&P , NPO status , Patient's Chart, lab work & pertinent test results  Airway Mallampati: I TM Distance: >3 FB Neck ROM: full    Dental  (+) Teeth Intact   Pulmonary          Cardiovascular Rhythm:regular Rate:Normal     Neuro/Psych    GI/Hepatic GERD-  ,  Endo/Other    Renal/GU      Musculoskeletal   Abdominal   Peds  Hematology   Anesthesia Other Findings   Reproductive/Obstetrics                           Anesthesia Physical Anesthesia Plan  ASA: I  Anesthesia Plan: General   Post-op Pain Management:    Induction: Intravenous  Airway Management Planned: LMA and Oral ETT  Additional Equipment:   Intra-op Plan:   Post-operative Plan: Extubation in OR  Informed Consent: I have reviewed the patients History and Physical, chart, labs and discussed the procedure including the risks, benefits and alternatives for the proposed anesthesia with the patient or authorized representative who has indicated his/her understanding and acceptance.     Plan Discussed with: CRNA, Anesthesiologist and Surgeon  Anesthesia Plan Comments:         Anesthesia Quick Evaluation

## 2011-09-27 NOTE — Anesthesia Postprocedure Evaluation (Signed)
Anesthesia Post Note  Patient: Christopher Potts  Procedure(s) Performed: Procedure(s) (LRB): PATELLA TENDON REPAIR (Left)  Anesthesia type: general  Patient location: PACU  Post pain: Pain level controlled  Post assessment: Patient's Cardiovascular Status Stable  Last Vitals:  Filed Vitals:   09/27/11 1445  BP:   Pulse: 75  Temp:   Resp: 18    Post vital signs: Reviewed and stable  Level of consciousness: sedated  Complications: No apparent anesthesia complications

## 2011-09-27 NOTE — Anesthesia Procedure Notes (Signed)
Procedure Name: LMA Insertion Date/Time: 09/27/2011 1:00 PM Performed by: Jefm Miles E Pre-anesthesia Checklist: Patient identified, Timeout performed, Emergency Drugs available, Suction available and Patient being monitored Patient Re-evaluated:Patient Re-evaluated prior to inductionOxygen Delivery Method: Circle system utilized Preoxygenation: Pre-oxygenation with 100% oxygen Intubation Type: IV induction Ventilation: Mask ventilation without difficulty LMA: LMA inserted LMA Size: 5.0 Number of attempts: 1 Tube secured with: Tape Dental Injury: Teeth and Oropharynx as per pre-operative assessment

## 2011-09-27 NOTE — Transfer of Care (Signed)
Immediate Anesthesia Transfer of Care Note  Patient: Christopher Potts  Procedure(s) Performed: Procedure(s) (LRB): PATELLA TENDON REPAIR (Left)  Patient Location: PACU  Anesthesia Type: General  Level of Consciousness: awake, alert  and oriented  Airway & Oxygen Therapy: Patient Spontanous Breathing and Patient connected to nasal cannula oxygen  Post-op Assessment: Report given to PACU RN  Post vital signs: Reviewed and stable  Complications: No apparent anesthesia complications

## 2011-09-28 MED ORDER — HYDROCODONE-ACETAMINOPHEN 5-500 MG PO TABS: 1.0000 | ORAL_TABLET | Freq: Four times a day (QID) | ORAL | Status: AC | PRN
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
## 2011-09-28 NOTE — Progress Notes (Signed)
Patient ID: Christopher Potts, male   DOB: 1946-03-19, 66 y.o.   MRN: [id number] Looks good overall.  Comfortable. Incision intact. Plan to d/c to home today.

## 2011-09-28 NOTE — Discharge Summary (Signed)
Patient ID: Christopher Potts MRN: [id number] DOB/AGE: March 12, 1946 66 y.o.  Admit date: 09/27/2011 Discharge date: 09/28/2011  Admission Diagnoses:  Active Problems:  * No active hospital problems. *    Discharge Diagnoses:  Same  Past Medical History  Diagnosis Date  . Heart murmur   . Blood transfusion   . GERD (gastroesophageal reflux disease)   . PONV (postoperative nausea and vomiting)   . Constipation     due to pain med    Surgeries: Procedure(s): PATELLA TENDON REPAIR on 09/27/2011   Consultants:    Discharged Condition: Improved  Hospital Course: Christopher Potts is an 66 y.o. male who was admitted 09/27/2011 for operative treatment of<principal problem not specified>. Patient has severe unremitting pain that affects sleep, daily activities, and work/hobbies. After pre-op clearance the patient was taken to the operating room on 09/27/2011 and underwent  Procedure(s): PATELLA TENDON REPAIR.    Patient was given perioperative antibiotics: Anti-infectives     Start     Dose/Rate Route Frequency Ordered Stop   09/27/11 1900   ceFAZolin (ANCEF) IVPB 1 g/50 mL premix        1 g 100 mL/hr over 30 Minutes Intravenous Every 6 hours 09/27/11 1537 09/28/11 0715   09/27/11 1330   vancomycin (VANCOCIN) powder 1,000 mg  Status:  Discontinued        1,000 mg Other To Surgery 09/27/11 1321 09/27/11 1456   09/27/11 1330   gentamicin (GARAMYCIN) injection 240 mg  Status:  Discontinued        240 mg Intramuscular To Surgery 09/27/11 1321 09/27/11 1456   09/27/11 1047   ceFAZolin (ANCEF) 2-3 GM-% IVPB SOLR     Comments: WILHELM, JENNIFER: cabinet override         09/27/11 1047 09/27/11 2259   09/27/11 1044   ceFAZolin (ANCEF) IVPB 2 g/50 mL premix        2 g 100 mL/hr over 30 Minutes Intravenous 60 min pre-op 09/27/11 1044 09/27/11 1253   09/27/11 1042   ceFAZolin (ANCEF) IVPB 1 g/50 mL premix  Status:  Discontinued        1 g 100 mL/hr over 30 Minutes Intravenous 60 min pre-op  09/27/11 1042 09/27/11 1044           Patient was given sequential compression devices, early ambulation, and chemoprophylaxis to prevent DVT.  Patient benefited maximally from hospital stay and there were no complications.    Recent vital signs: Patient Vitals for the past 24 hrs:  BP Temp Temp src Pulse Resp SpO2 Height Weight  09/28/11 0700 153/66 mmHg 98.6 F (37 C) - 80  18  99 % - -  09/27/11 2139 149/64 mmHg 98.1 F (36.7 C) - 82  18  99 % - -  09/27/11 1500 150/60 mmHg 97.4 F (36.3 C) Oral 77  16  98 % - -  09/27/11 1445 133/62 mmHg 97 F (36.1 C) - 75  18  98 % - -  09/27/11 1430 129/70 mmHg - - 71  18  98 % - -  09/27/11 1415 136/61 mmHg - - 72  10  100 % - -  09/27/11 1410 132/64 mmHg 97.8 F (36.6 C) - 68  8  100 % - -  09/27/11 1036 132/81 mmHg 98 F (36.7 C) Oral 102  18  99 % 6\' 1"  (1.854 m) 91.627 kg (202 lb)     Recent laboratory studies:  Basename 09/27/11 1100  WBC 6.9  HGB  12.1*  HCT 37.2*  PLT 316  NA 139  K 4.6  CL 103  CO2 26  BUN 13  CREATININE 0.88  GLUCOSE 116*  INR 1.06  CALCIUM 9.5     Discharge Medications:   Medication List  As of 09/28/2011  8:47 AM   TAKE these medications         HYDROcodone-acetaminophen 5-500 MG per tablet   Commonly known as: VICODIN   Take 1-2 tablets by mouth every 6 (six) hours as needed for pain.      mulitivitamin with minerals Tabs   Take 1 tablet by mouth daily.      ranitidine 75 MG tablet   Commonly known as: ZANTAC   Take 75 mg by mouth daily as needed. For acid reflux      senna 8.6 MG Tabs   Commonly known as: SENOKOT   Take 1 tablet (8.6 mg total) by mouth daily as needed.      warfarin 1 MG tablet   Commonly known as: COUMADIN   Take 1 tablet (1 mg total) by mouth daily.         ASK your doctor about these medications         traMADol 50 MG tablet   Commonly known as: ULTRAM   Take 1 tablet (50 mg total) by mouth every 6 (six) hours as needed for pain. Maximum dose= 8 tablets  per day            Diagnostic Studies: Dg Chest 2 View  09/04/2011  *RADIOLOGY REPORT*  Clinical Data: Preoperative respiratory exam for left knee arthroplasty  CHEST - 2 VIEW  Comparison: 09/22/2010  Findings: Heart size is normal.  Mediastinal shadows are normal. Lungs are clear.  No effusions.  No bony abnormalities.  IMPRESSION: Normal chest  Original Report Authenticated By: Thomasenia Sales, M.D.   Dg Knee Left Port  09/12/2011  *RADIOLOGY REPORT*  Clinical Data: Postop knee arthroplasty.  PORTABLE LEFT KNEE - 1-2 VIEW  Comparison: 09/30/2010.  Findings: There has been removal of plates and screws from the proximal tibia with interval arthroplasty.  Small radiopaque pellets along the knee joint may represent antibiotic beads.  A healed proximal fibular fracture is noted.  Joint air and fluid are present.  IMPRESSION: Interval knee arthroplasty with expected postoperative findings.  Original Report Authenticated By: Reyes Ivan, M.D.    Disposition: 01-Home or Self Care  Discharge Orders    Future Orders Please Complete By Expires   Diet - low sodium heart healthy      Call MD / Call 911      Comments:   If you experience chest pain or shortness of breath, CALL 911 and be transported to the hospital emergency room.  If you develope a fever above 101 F, pus (white drainage) or increased drainage or redness at the wound, or calf pain, call your surgeon's office.   Constipation Prevention      Comments:   Drink plenty of fluids.  Prune juice may be helpful.  You may use a stool softener, such as Colace (over the counter) 100 mg twice a day.  Use MiraLax (over the counter) for constipation as needed.   Increase activity slowly as tolerated      Weight Bearing as taught in Physical Therapy      Comments:   Use a walker or crutches as instructed.   Discharge instructions      Comments:   Keep your incision  clean and dry. You may put full weight on your leg, but no bending of your  knee.   Discontinue IV      Discharge patient      Comments:   D/c to home today         Signed: Kathryne Hitch 09/28/2011, 8:47 AM

## 2011-09-30 ENCOUNTER — Encounter (HOSPITAL_COMMUNITY): Payer: Self-pay | Admitting: Orthopedic Surgery

## 2011-11-07 ENCOUNTER — Other Ambulatory Visit (HOSPITAL_COMMUNITY): Payer: Self-pay | Admitting: Orthopedic Surgery

## 2011-11-12 ENCOUNTER — Encounter (HOSPITAL_COMMUNITY): Payer: Self-pay | Admitting: *Deleted

## 2011-11-12 MED ORDER — CEFAZOLIN SODIUM-DEXTROSE 2-3 GM-% IV SOLR
2.0000 g | INTRAVENOUS | Status: AC
  Administered 2011-11-13: 2 g via INTRAVENOUS
  Filled 2011-11-12: qty 50

## 2011-11-13 ENCOUNTER — Encounter (HOSPITAL_COMMUNITY): Payer: Self-pay | Admitting: Vascular Surgery

## 2011-11-13 ENCOUNTER — Ambulatory Visit (HOSPITAL_COMMUNITY)
Admission: RE | Admit: 2011-11-13 | Discharge: 2011-11-13 | Disposition: A | Discharge status: Home / Self Care | Payer: Medicare Other | Source: Ambulatory Visit | Attending: Orthopedic Surgery | Admitting: Orthopedic Surgery

## 2011-11-13 ENCOUNTER — Encounter (HOSPITAL_COMMUNITY): Payer: Self-pay | Admitting: *Deleted

## 2011-11-13 ENCOUNTER — Ambulatory Visit (HOSPITAL_COMMUNITY): Payer: Medicare Other | Admitting: Vascular Surgery

## 2011-11-13 ENCOUNTER — Encounter (HOSPITAL_COMMUNITY)
Admission: RE | Disposition: A | Discharge status: Home / Self Care | Payer: Self-pay | Source: Ambulatory Visit | Attending: Orthopedic Surgery

## 2011-11-13 DIAGNOSIS — T8132XA Disruption of internal operation (surgical) wound, not elsewhere classified, initial encounter: Secondary | ICD-10-CM | POA: Insufficient documentation

## 2011-11-13 DIAGNOSIS — T8131XA Disruption of external operation (surgical) wound, not elsewhere classified, initial encounter: Secondary | ICD-10-CM

## 2011-11-13 DIAGNOSIS — T81329A Deep disruption or dehiscence of operation wound, unspecified, initial encounter: Secondary | ICD-10-CM | POA: Insufficient documentation

## 2011-11-13 DIAGNOSIS — Y834 Other reconstructive surgery as the cause of abnormal reaction of the patient, or of later complication, without mention of misadventure at the time of the procedure: Secondary | ICD-10-CM | POA: Insufficient documentation

## 2011-11-13 DIAGNOSIS — T85898A Other specified complication of other internal prosthetic devices, implants and grafts, initial encounter: Secondary | ICD-10-CM | POA: Insufficient documentation

## 2011-11-13 DIAGNOSIS — R011 Cardiac murmur, unspecified: Secondary | ICD-10-CM | POA: Insufficient documentation

## 2011-11-13 DIAGNOSIS — K5900 Constipation, unspecified: Secondary | ICD-10-CM | POA: Insufficient documentation

## 2011-11-13 DIAGNOSIS — K219 Gastro-esophageal reflux disease without esophagitis: Secondary | ICD-10-CM | POA: Insufficient documentation

## 2011-11-13 HISTORY — PX: I & D EXTREMITY: SHX5045

## 2011-11-13 LAB — COMPREHENSIVE METABOLIC PANEL
ALT: 15 U/L (ref 0–53)
AST: 25 U/L (ref 0–37)
Albumin: 3.6 g/dL (ref 3.5–5.2)
Calcium: 9.2 mg/dL (ref 8.4–10.5)
Creatinine, Ser: 0.99 mg/dL (ref 0.50–1.35)
Sodium: 141 mEq/L (ref 135–145)

## 2011-11-13 LAB — CBC
MCH: 27 pg (ref 26.0–34.0)
MCV: 81.6 fL (ref 78.0–100.0)
Platelets: 167 10*3/uL (ref 150–400)
RBC: 5 MIL/uL (ref 4.22–5.81)
RDW: 14.2 % (ref 11.5–15.5)
WBC: 4.6 10*3/uL (ref 4.0–10.5)

## 2011-11-13 LAB — SURGICAL PCR SCREEN
MRSA, PCR: NEGATIVE
Staphylococcus aureus: NEGATIVE

## 2011-11-13 LAB — PROTIME-INR: INR: 1.09 (ref 0.00–1.49)

## 2011-11-13 SURGERY — IRRIGATION AND DEBRIDEMENT EXTREMITY
Anesthesia: General | Site: Knee | Laterality: Left | Wound class: Dirty or Infected

## 2011-11-13 MED ORDER — FENTANYL CITRATE 0.05 MG/ML IJ SOLN
INTRAMUSCULAR | Status: DC | PRN
  Administered 2011-11-13: 100 ug via INTRAVENOUS

## 2011-11-13 MED ORDER — DROPERIDOL 2.5 MG/ML IJ SOLN
INTRAMUSCULAR | Status: DC | PRN
  Administered 2011-11-13: 0.625 mg via INTRAVENOUS

## 2011-11-13 MED ORDER — SODIUM CHLORIDE 0.9 % IR SOLN
Status: DC | PRN
  Administered 2011-11-13: 1000 mL

## 2011-11-13 MED ORDER — LACTATED RINGERS IV SOLN
INTRAVENOUS | Status: DC
  Administered 2011-11-13: 09:00:00 via INTRAVENOUS

## 2011-11-13 MED ORDER — MUPIROCIN 2 % EX OINT
TOPICAL_OINTMENT | CUTANEOUS | Status: AC
  Filled 2011-11-13: qty 22

## 2011-11-13 MED ORDER — DEXAMETHASONE SODIUM PHOSPHATE 10 MG/ML IJ SOLN
INTRAMUSCULAR | Status: DC | PRN
  Administered 2011-11-13: 8 mg via INTRAVENOUS

## 2011-11-13 MED ORDER — VANCOMYCIN HCL 1000 MG IV SOLR
1000.0000 mg | INTRAVENOUS | Status: DC
  Filled 2011-11-13: qty 1000

## 2011-11-13 MED ORDER — PROPOFOL 10 MG/ML IV EMUL
INTRAVENOUS | Status: DC | PRN
  Administered 2011-11-13: 150 mg via INTRAVENOUS

## 2011-11-13 MED ORDER — LIDOCAINE HCL (CARDIAC) 20 MG/ML IV SOLN
INTRAVENOUS | Status: DC | PRN
  Administered 2011-11-13: 50 mg via INTRAVENOUS

## 2011-11-13 MED ORDER — GENTAMICIN SULFATE 40 MG/ML IJ SOLN
INTRAMUSCULAR | Status: DC | PRN
  Administered 2011-11-13: 240 mg via INTRAMUSCULAR

## 2011-11-13 MED ORDER — VANCOMYCIN HCL 1000 MG IV SOLR
INTRAVENOUS | Status: DC | PRN
  Administered 2011-11-13: 1000 mg

## 2011-11-13 MED ORDER — EPHEDRINE SULFATE 50 MG/ML IJ SOLN
INTRAMUSCULAR | Status: DC | PRN
  Administered 2011-11-13 (×2): 5 mg via INTRAVENOUS

## 2011-11-13 MED ORDER — MUPIROCIN 2 % EX OINT
TOPICAL_OINTMENT | Freq: Once | CUTANEOUS | Status: AC
  Administered 2011-11-13: 08:00:00 via NASAL

## 2011-11-13 MED ORDER — ONDANSETRON HCL 4 MG/2ML IJ SOLN
INTRAMUSCULAR | Status: DC | PRN
  Administered 2011-11-13: 4 mg via INTRAVENOUS

## 2011-11-13 MED ORDER — HYDROMORPHONE HCL PF 1 MG/ML IJ SOLN: 0.2500 mg | INTRAMUSCULAR | Status: DC | PRN

## 2011-11-13 MED ORDER — LACTATED RINGERS IV SOLN
INTRAVENOUS | Status: DC | PRN
  Administered 2011-11-13 (×3): via INTRAVENOUS

## 2011-11-13 MED ORDER — MIDAZOLAM HCL 5 MG/5ML IJ SOLN
INTRAMUSCULAR | Status: DC | PRN
  Administered 2011-11-13: 2 mg via INTRAVENOUS

## 2011-11-13 SURGICAL SUPPLY — 53 items
BANDAGE GAUZE ELAST BULKY 4 IN (GAUZE/BANDAGES/DRESSINGS) ×2 IMPLANT
BLADE SURG 10 STRL SS (BLADE) ×2 IMPLANT
BNDG COHESIVE 4X5 TAN STRL (GAUZE/BANDAGES/DRESSINGS) ×2 IMPLANT
BNDG COHESIVE 6X5 TAN STRL LF (GAUZE/BANDAGES/DRESSINGS) ×2 IMPLANT
BNDG GAUZE STRTCH 6 (GAUZE/BANDAGES/DRESSINGS) IMPLANT
CLOTH BEACON ORANGE TIMEOUT ST (SAFETY) ×2 IMPLANT
COTTON STERILE ROLL (GAUZE/BANDAGES/DRESSINGS) IMPLANT
COVER SURGICAL LIGHT HANDLE (MISCELLANEOUS) ×2 IMPLANT
CUFF TOURNIQUET SINGLE 18IN (TOURNIQUET CUFF) IMPLANT
CUFF TOURNIQUET SINGLE 24IN (TOURNIQUET CUFF) IMPLANT
CUFF TOURNIQUET SINGLE 34IN LL (TOURNIQUET CUFF) IMPLANT
CUFF TOURNIQUET SINGLE 44IN (TOURNIQUET CUFF) IMPLANT
DRAPE U-SHAPE 47X51 STRL (DRAPES) ×2 IMPLANT
DRSG ADAPTIC 3X8 NADH LF (GAUZE/BANDAGES/DRESSINGS) IMPLANT
DRSG PAD ABDOMINAL 8X10 ST (GAUZE/BANDAGES/DRESSINGS) ×2 IMPLANT
DURAPREP 26ML APPLICATOR (WOUND CARE) ×2 IMPLANT
ELECT CAUTERY BLADE 6.4 (BLADE) ×2 IMPLANT
ELECT REM PT RETURN 9FT ADLT (ELECTROSURGICAL) ×2
ELECTRODE REM PT RTRN 9FT ADLT (ELECTROSURGICAL) ×1 IMPLANT
GLOVE BIOGEL PI IND STRL 9 (GLOVE) ×1 IMPLANT
GLOVE BIOGEL PI INDICATOR 9 (GLOVE) ×1
GLOVE SURG ORTHO 9.0 STRL STRW (GLOVE) ×2 IMPLANT
GOWN PREVENTION PLUS XLARGE (GOWN DISPOSABLE) IMPLANT
GOWN SRG XL XLNG 56XLVL 4 (GOWN DISPOSABLE) IMPLANT
GOWN STRL NON-REIN XL XLG LVL4 (GOWN DISPOSABLE)
HANDPIECE INTERPULSE COAX TIP (DISPOSABLE)
KIT BASIN OR (CUSTOM PROCEDURE TRAY) ×2 IMPLANT
KIT ROOM TURNOVER OR (KITS) ×2 IMPLANT
KIT STIMULAN RAPID CURE  10CC (Orthopedic Implant) ×1 IMPLANT
KIT STIMULAN RAPID CURE 10CC (Orthopedic Implant) ×1 IMPLANT
MANIFOLD NEPTUNE II (INSTRUMENTS) ×2 IMPLANT
NEEDLE 18GX1X1/2 (RX/OR ONLY) (NEEDLE) ×2 IMPLANT
NEEDLE MAYO 1/2 CIRCLE (NEEDLE) ×2 IMPLANT
NS IRRIG 1000ML POUR BTL (IV SOLUTION) ×2 IMPLANT
PACK ORTHO EXTREMITY (CUSTOM PROCEDURE TRAY) ×2 IMPLANT
PAD ARMBOARD 7.5X6 YLW CONV (MISCELLANEOUS) ×2 IMPLANT
PADDING CAST COTTON 6X4 STRL (CAST SUPPLIES) IMPLANT
SET HNDPC FAN SPRY TIP SCT (DISPOSABLE) IMPLANT
SPONGE GAUZE 4X4 12PLY (GAUZE/BANDAGES/DRESSINGS) IMPLANT
SPONGE LAP 18X18 X RAY DECT (DISPOSABLE) ×2 IMPLANT
STOCKINETTE IMPERVIOUS 9X36 MD (GAUZE/BANDAGES/DRESSINGS) ×2 IMPLANT
SUT ETHILON 2 0 PSLX (SUTURE) ×2 IMPLANT
SUT FIBERWIRE #2 38 REV NDL BL (SUTURE) ×4
SUTURE FIBERWR#2 38 REV NDL BL (SUTURE) ×2 IMPLANT
SWAB COLLECTION DEVICE MRSA (MISCELLANEOUS) ×2 IMPLANT
SYRINGE 10CC LL (SYRINGE) ×2 IMPLANT
TOWEL OR 17X24 6PK STRL BLUE (TOWEL DISPOSABLE) ×2 IMPLANT
TOWEL OR 17X26 10 PK STRL BLUE (TOWEL DISPOSABLE) ×2 IMPLANT
TUBE ANAEROBIC SPECIMEN COL (MISCELLANEOUS) ×2 IMPLANT
TUBE CONNECTING 12X1/4 (SUCTIONS) ×2 IMPLANT
UNDERPAD 30X30 INCONTINENT (UNDERPADS AND DIAPERS) IMPLANT
WATER STERILE IRR 1000ML POUR (IV SOLUTION) IMPLANT
YANKAUER SUCT BULB TIP NO VENT (SUCTIONS) ×2 IMPLANT

## 2011-11-13 NOTE — Anesthesia Procedure Notes (Signed)
Procedure Name: LMA Insertion Date/Time: 11/13/2011 10:17 AM Performed by: Garen Lah Pre-anesthesia Checklist: Patient identified, Timeout performed, Emergency Drugs available, Suction available and Patient being monitored Patient Re-evaluated:Patient Re-evaluated prior to inductionOxygen Delivery Method: Circle system utilized Preoxygenation: Pre-oxygenation with 100% oxygen Intubation Type: IV induction LMA: LMA inserted LMA Size: 4.0 Number of attempts: 1 Placement Confirmation: positive ETCO2 and breath sounds checked- equal and bilateral Tube secured with: Tape Dental Injury: Teeth and Oropharynx as per pre-operative assessment

## 2011-11-13 NOTE — Anesthesia Preprocedure Evaluation (Addendum)
Anesthesia Evaluation  Patient identified by MRN, date of birth, ID band Patient awake    Reviewed: Allergy & Precautions, H&P , NPO status , Patient's Chart, lab work & pertinent test results  History of Anesthesia Complications (+) PONV  Airway Mallampati: I TM Distance: >3 FB Neck ROM: Full    Dental No notable dental hx. (+) Teeth Intact and Dental Advisory Given   Pulmonary neg pulmonary ROS,  breath sounds clear to auscultation  Pulmonary exam normal       Cardiovascular + Valvular Problems/Murmurs Rhythm:Regular Rate:Normal + Systolic murmurs    Neuro/Psych negative neurological ROS  negative psych ROS   GI/Hepatic Neg liver ROS, GERD-  Medicated and Controlled,  Endo/Other  negative endocrine ROS  Renal/GU negative Renal ROS  negative genitourinary   Musculoskeletal   Abdominal   Peds  Hematology negative hematology ROS (+)   Anesthesia Other Findings   Reproductive/Obstetrics negative OB ROS                          Anesthesia Physical Anesthesia Plan  ASA: II  Anesthesia Plan: General   Post-op Pain Management:    Induction: Intravenous  Airway Management Planned: LMA  Additional Equipment:   Intra-op Plan:   Post-operative Plan: Extubation in OR  Informed Consent: I have reviewed the patients History and Physical, chart, labs and discussed the procedure including the risks, benefits and alternatives for the proposed anesthesia with the patient or authorized representative who has indicated his/her understanding and acceptance.   Dental advisory given  Plan Discussed with: CRNA  Anesthesia Plan Comments:         Anesthesia Quick Evaluation

## 2011-11-13 NOTE — Transfer of Care (Signed)
Immediate Anesthesia Transfer of Care Note  Patient: Christopher Potts  Procedure(s) Performed: Procedure(s) (LRB): IRRIGATION AND DEBRIDEMENT EXTREMITY (Left)  Patient Location: PACU  Anesthesia Type: General  Level of Consciousness: awake, alert  and patient cooperative  Airway & Oxygen Therapy: Patient Spontanous Breathing and Patient connected to nasal cannula oxygen  Post-op Assessment: Report given to PACU RN and Post -op Vital signs reviewed and stable  Post vital signs: Reviewed  Complications: No apparent anesthesia complications
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
## 2011-11-13 NOTE — Anesthesia Postprocedure Evaluation (Signed)
  Anesthesia Post-op Note  Patient: Christopher Potts  Procedure(s) Performed: Procedure(s) (LRB): IRRIGATION AND DEBRIDEMENT EXTREMITY (Left)  Patient Location: PACU  Anesthesia Type: General  Level of Consciousness: awake  Airway and Oxygen Therapy: Patient Spontanous Breathing and Patient connected to nasal cannula oxygen  Post-op Pain: mild  Post-op Assessment: Post-op Vital signs reviewed, Patient's Cardiovascular Status Stable, Respiratory Function Stable and Patent Airway  Post-op Vital Signs: Reviewed and stable  Complications: No apparent anesthesia complications

## 2011-11-13 NOTE — Op Note (Signed)
OPERATIVE REPORT  DATE OF SURGERY: 11/13/2011  PATIENT:  Christopher Potts,  66 y.o. male  PRE-OPERATIVE DIAGNOSIS:  Left Knee incision dehiscence  POST-OPERATIVE DIAGNOSIS:  Left Knee incision dehiscence  PROCEDURE:  Procedure(s): IRRIGATION AND DEBRIDEMENT EXTREMITY. Excisional debridement with Ronjair wound measurement 3 x 5 cm with debridement skin soft tissue and bone. Application of a cell xenograft powder. Application of stimulant antibiotic beads with 1 g vancomycin and 240 mg gentamicin. Repair patella tendon.  SURGEON:  Surgeon(s): Nadara Mustard, MD  ANESTHESIA:   general  EBL:  Minimal ML  SPECIMEN:  No Specimen  TOURNIQUET:  * No tourniquets in log *  PROCEDURE DETAILS: Patient is a 66 year old gentleman who presents for evaluation of his left knee. Patient has had wound dehiscence status post a tibial plateau fracture with a hinged knee revision and patella tendon reconstruction who presents at this time for wound dehiscence anteriorly. Risks and benefits were discussed including chronic infection nonhealing of the wound need for additional surgery. Patient states he understands and wished to proceed at this time. Description of procedure patient brought to or room 1 and underwent a general anesthetic. After adequate levels of anesthesia were obtained patient's left lower extremity was prepped using DuraPrep and draped into a sterile field. A longitudinal incision was made and encompassed the 2 necrotic wound areas which are 1 cm in diameter. The tissue was ellipsed out in one block of tissue. There was nonviable and a cell xenograft tissue graft which was debrided. There was a partial breakdown of the patella tendon and this was debrided of skin soft tissue and muscle with a Ronjair. And a the wound was irrigated with pulsatile lavage x6 L. The antibiotic beads were made with 10 cc of stimulant +1 g vancomycin and 240 mg of gentamicin. The antibiotic beads were placed deep  within the knee joint. Wound cultures were obtained prior to the placement of antibiotic beads. Patella tendon was then reapproximated using the #2 FiberWire with sutured to bone. The patella tendon was then covered with the Ace cell xenograft powder. The incision was closed using 2-0 nylon the wound edges closed well. The wound was covered with Adaptic orthopedic sponges AB dressing Kerlix and Coban and the patient was placed in knee immobilizer to keep the knee extended plan to followup in the office in 2 weeks.                               PLAN OF CARE: Discharge to home after PACU  PATIENT DISPOSITION:  PACU - hemodynamically stable.   Nadara Mustard, MD 11/13/2011 11:28 AM

## 2011-11-13 NOTE — Preoperative (Signed)
Beta Blockers   Reason not to administer Beta Blockers:Not Applicable. No home beta blockers 

## 2011-11-13 NOTE — Discharge Instructions (Signed)
Keith knee extended do not flexed the knee until followup.

## 2011-11-14 ENCOUNTER — Encounter (HOSPITAL_COMMUNITY): Payer: Self-pay | Admitting: Orthopedic Surgery

## 2011-11-16 LAB — WOUND CULTURE: Culture: NO GROWTH

## 2011-11-18 LAB — ANAEROBIC CULTURE: Gram Stain: NONE SEEN

## 2012-04-24 DEATH — deceased

## 2017-03-05 ENCOUNTER — Telehealth: Payer: Self-pay | Admitting: Oncology

## 2017-03-05 ENCOUNTER — Encounter: Payer: Self-pay | Admitting: Oncology

## 2017-03-05 NOTE — Telephone Encounter (Signed)
Received a call from the pt's daughter, [name], to get the pt transferred from [facility]. Mrs. Oletta Lamas states that she is not happy with the care her father has been receiving. Pt and his daughter has requested to see Dr. Benay Spice. Appt has been scheduled for the pt to see Dr. Benay Spice on 9/20 at 2pm. Aware to arrive 30 minutes early. Pt's address verified. Letter mailed.

## 2017-03-10 ENCOUNTER — Encounter (HOSPITAL_COMMUNITY): Payer: Self-pay | Admitting: *Deleted

## 2017-03-10 ENCOUNTER — Emergency Department (HOSPITAL_COMMUNITY): Payer: Medicare HMO

## 2017-03-10 ENCOUNTER — Inpatient Hospital Stay (HOSPITAL_COMMUNITY)
Admission: EM | Admit: 2017-03-10 | Discharge: 2017-03-21 | DRG: 682 | Disposition: A | Discharge status: Home Health | Payer: Medicare HMO | Attending: Family Medicine | Admitting: Family Medicine

## 2017-03-10 DIAGNOSIS — C786 Secondary malignant neoplasm of retroperitoneum and peritoneum: Secondary | ICD-10-CM | POA: Diagnosis not present

## 2017-03-10 DIAGNOSIS — E871 Hypo-osmolality and hyponatremia: Secondary | ICD-10-CM | POA: Diagnosis present

## 2017-03-10 DIAGNOSIS — Z515 Encounter for palliative care: Secondary | ICD-10-CM | POA: Diagnosis not present

## 2017-03-10 DIAGNOSIS — E43 Unspecified severe protein-calorie malnutrition: Secondary | ICD-10-CM | POA: Diagnosis present

## 2017-03-10 DIAGNOSIS — C800 Disseminated malignant neoplasm, unspecified: Secondary | ICD-10-CM | POA: Diagnosis not present

## 2017-03-10 DIAGNOSIS — R64 Cachexia: Secondary | ICD-10-CM | POA: Diagnosis not present

## 2017-03-10 DIAGNOSIS — Z0189 Encounter for other specified special examinations: Secondary | ICD-10-CM

## 2017-03-10 DIAGNOSIS — E873 Alkalosis: Secondary | ICD-10-CM | POA: Diagnosis present

## 2017-03-10 DIAGNOSIS — Z91013 Allergy to seafood: Secondary | ICD-10-CM

## 2017-03-10 DIAGNOSIS — R634 Abnormal weight loss: Secondary | ICD-10-CM | POA: Diagnosis not present

## 2017-03-10 DIAGNOSIS — Z933 Colostomy status: Secondary | ICD-10-CM

## 2017-03-10 DIAGNOSIS — I959 Hypotension, unspecified: Secondary | ICD-10-CM | POA: Diagnosis not present

## 2017-03-10 DIAGNOSIS — K219 Gastro-esophageal reflux disease without esophagitis: Secondary | ICD-10-CM | POA: Diagnosis present

## 2017-03-10 DIAGNOSIS — Z96652 Presence of left artificial knee joint: Secondary | ICD-10-CM | POA: Diagnosis present

## 2017-03-10 DIAGNOSIS — R011 Cardiac murmur, unspecified: Secondary | ICD-10-CM | POA: Diagnosis present

## 2017-03-10 DIAGNOSIS — E876 Hypokalemia: Secondary | ICD-10-CM | POA: Diagnosis present

## 2017-03-10 DIAGNOSIS — R627 Adult failure to thrive: Secondary | ICD-10-CM | POA: Diagnosis present

## 2017-03-10 DIAGNOSIS — C7A020 Malignant carcinoid tumor of the appendix: Secondary | ICD-10-CM | POA: Diagnosis not present

## 2017-03-10 DIAGNOSIS — K56699 Other intestinal obstruction unspecified as to partial versus complete obstruction: Secondary | ICD-10-CM | POA: Diagnosis not present

## 2017-03-10 DIAGNOSIS — N179 Acute kidney failure, unspecified: Secondary | ICD-10-CM | POA: Diagnosis not present

## 2017-03-10 DIAGNOSIS — Z79899 Other long term (current) drug therapy: Secondary | ICD-10-CM

## 2017-03-10 DIAGNOSIS — D3A020 Benign carcinoid tumor of the appendix: Secondary | ICD-10-CM

## 2017-03-10 DIAGNOSIS — K56609 Unspecified intestinal obstruction, unspecified as to partial versus complete obstruction: Secondary | ICD-10-CM | POA: Diagnosis not present

## 2017-03-10 DIAGNOSIS — N132 Hydronephrosis with renal and ureteral calculous obstruction: Secondary | ICD-10-CM | POA: Diagnosis present

## 2017-03-10 DIAGNOSIS — C181 Malignant neoplasm of appendix: Secondary | ICD-10-CM | POA: Diagnosis present

## 2017-03-10 DIAGNOSIS — R9431 Abnormal electrocardiogram [ECG] [EKG]: Secondary | ICD-10-CM | POA: Diagnosis present

## 2017-03-10 DIAGNOSIS — Z800 Family history of malignant neoplasm of digestive organs: Secondary | ICD-10-CM

## 2017-03-10 DIAGNOSIS — Z66 Do not resuscitate: Secondary | ICD-10-CM | POA: Diagnosis present

## 2017-03-10 DIAGNOSIS — R112 Nausea with vomiting, unspecified: Secondary | ICD-10-CM

## 2017-03-10 DIAGNOSIS — Z9049 Acquired absence of other specified parts of digestive tract: Secondary | ICD-10-CM

## 2017-03-10 DIAGNOSIS — E860 Dehydration: Secondary | ICD-10-CM

## 2017-03-10 DIAGNOSIS — Z7189 Other specified counseling: Secondary | ICD-10-CM | POA: Diagnosis not present

## 2017-03-10 DIAGNOSIS — Z8489 Family history of other specified conditions: Secondary | ICD-10-CM | POA: Diagnosis not present

## 2017-03-10 DIAGNOSIS — Z681 Body mass index (BMI) 19 or less, adult: Secondary | ICD-10-CM | POA: Diagnosis not present

## 2017-03-10 DIAGNOSIS — Z91018 Allergy to other foods: Secondary | ICD-10-CM

## 2017-03-10 DIAGNOSIS — R110 Nausea: Secondary | ICD-10-CM | POA: Diagnosis not present

## 2017-03-10 DIAGNOSIS — Z807 Family history of other malignant neoplasms of lymphoid, hematopoietic and related tissues: Secondary | ICD-10-CM

## 2017-03-10 DIAGNOSIS — R34 Anuria and oliguria: Secondary | ICD-10-CM | POA: Diagnosis present

## 2017-03-10 DIAGNOSIS — N1330 Unspecified hydronephrosis: Secondary | ICD-10-CM

## 2017-03-10 DIAGNOSIS — E861 Hypovolemia: Secondary | ICD-10-CM | POA: Diagnosis present

## 2017-03-10 DIAGNOSIS — R52 Pain, unspecified: Secondary | ICD-10-CM

## 2017-03-10 DIAGNOSIS — Z9221 Personal history of antineoplastic chemotherapy: Secondary | ICD-10-CM

## 2017-03-10 HISTORY — DX: Headache: R51

## 2017-03-10 HISTORY — DX: Malignant neoplasm of appendix: C18.1

## 2017-03-10 HISTORY — DX: Acute kidney failure, unspecified: N17.9

## 2017-03-10 HISTORY — DX: Anxiety disorder, unspecified: F41.9

## 2017-03-10 HISTORY — DX: Personal history of other medical treatment: Z92.89

## 2017-03-10 HISTORY — DX: Headache, unspecified: R51.9

## 2017-03-10 LAB — CBC
HCT: 38.6 % — ABNORMAL LOW (ref 39.0–52.0)
HEMOGLOBIN: 12.8 g/dL — AB (ref 13.0–17.0)
MCH: 27.9 pg (ref 26.0–34.0)
MCHC: 33.2 g/dL (ref 30.0–36.0)
MCV: 84.3 fL (ref 78.0–100.0)
Platelets: 239 10*3/uL (ref 150–400)
RBC: 4.58 MIL/uL (ref 4.22–5.81)
RDW: 14.6 % (ref 11.5–15.5)
WBC: 6.8 10*3/uL (ref 4.0–10.5)

## 2017-03-10 LAB — BASIC METABOLIC PANEL
ANION GAP: 16 — AB (ref 5–15)
BUN: 86 mg/dL — ABNORMAL HIGH (ref 6–20)
CHLORIDE: 72 mmol/L — AB (ref 101–111)
CO2: 41 mmol/L — ABNORMAL HIGH (ref 22–32)
Calcium: 9.2 mg/dL (ref 8.9–10.3)
Creatinine, Ser: 4.12 mg/dL — ABNORMAL HIGH (ref 0.61–1.24)
GFR calc Af Amer: 15 mL/min — ABNORMAL LOW (ref >60)
GFR, EST NON AFRICAN AMERICAN: 13 mL/min — AB (ref >60)
Glucose, Bld: 128 mg/dL — ABNORMAL HIGH (ref 65–99)
Potassium: 3.6 mmol/L (ref 3.5–5.1)
SODIUM: 129 mmol/L — AB (ref 135–145)

## 2017-03-10 MED ORDER — LACTATED RINGERS IV BOLUS (SEPSIS)
1000.0000 mL | Freq: Once | INTRAVENOUS | Status: AC
  Administered 2017-03-10: 1000 mL via INTRAVENOUS

## 2017-03-10 MED ORDER — ENSURE ENLIVE PO LIQD
237.0000 mL | Freq: Two times a day (BID) | ORAL | Status: AC
  Administered 2017-03-10: 237 mL via ORAL
  Filled 2017-03-10 (×3): qty 237

## 2017-03-10 MED ORDER — SODIUM CHLORIDE 0.9 % IV BOLUS (SEPSIS): 1000.0000 mL | Freq: Once | INTRAVENOUS | Status: DC

## 2017-03-10 MED ORDER — LACTATED RINGERS IV SOLN
INTRAVENOUS | Status: DC
  Administered 2017-03-10 – 2017-03-11 (×2): via INTRAVENOUS

## 2017-03-10 NOTE — ED Triage Notes (Signed)
Pt reports ongoing fatigue and weakness for several days. Having n/v and mid abd pain. Has colostomy which he states is draining good.
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
## 2017-03-10 NOTE — ED Notes (Signed)
Pt reports he has been feeling weaker over the past few weeks related to his immunotherapy drug

## 2017-03-11 ENCOUNTER — Inpatient Hospital Stay (HOSPITAL_COMMUNITY): Payer: Medicare HMO

## 2017-03-11 DIAGNOSIS — R112 Nausea with vomiting, unspecified: Secondary | ICD-10-CM

## 2017-03-11 DIAGNOSIS — E876 Hypokalemia: Secondary | ICD-10-CM

## 2017-03-11 LAB — BASIC METABOLIC PANEL
ANION GAP: 17 — AB (ref 5–15)
ANION GAP: 15 (ref 5–15)
BUN: 92 mg/dL — ABNORMAL HIGH (ref 6–20)
BUN: 90 mg/dL — AB (ref 6–20)
CALCIUM: 8.7 mg/dL — AB (ref 8.9–10.3)
CALCIUM: 8.6 mg/dL — AB (ref 8.9–10.3)
CO2: 38 mmol/L — ABNORMAL HIGH (ref 22–32)
CO2: 39 mmol/L — ABNORMAL HIGH (ref 22–32)
CREATININE: 5.36 mg/dL — AB (ref 0.61–1.24)
Chloride: 76 mmol/L — ABNORMAL LOW (ref 101–111)
Chloride: 79 mmol/L — ABNORMAL LOW (ref 101–111)
Creatinine, Ser: 6.23 mg/dL — ABNORMAL HIGH (ref 0.61–1.24)
GFR calc Af Amer: 9 mL/min — ABNORMAL LOW (ref >60)
GFR calc non Af Amer: 10 mL/min — ABNORMAL LOW (ref >60)
GFR, EST AFRICAN AMERICAN: 11 mL/min — AB (ref >60)
GFR, EST NON AFRICAN AMERICAN: 8 mL/min — AB (ref >60)
GLUCOSE: 122 mg/dL — AB (ref 65–99)
Glucose, Bld: 101 mg/dL — ABNORMAL HIGH (ref 65–99)
POTASSIUM: 3.6 mmol/L (ref 3.5–5.1)
Potassium: 3 mmol/L — ABNORMAL LOW (ref 3.5–5.1)
SODIUM: 131 mmol/L — AB (ref 135–145)
SODIUM: 133 mmol/L — AB (ref 135–145)

## 2017-03-11 LAB — URINALYSIS, ROUTINE W REFLEX MICROSCOPIC
Bilirubin Urine: NEGATIVE
Glucose, UA: NEGATIVE mg/dL
HGB URINE DIPSTICK: NEGATIVE
Ketones, ur: NEGATIVE mg/dL
Nitrite: NEGATIVE
PROTEIN: NEGATIVE mg/dL
Specific Gravity, Urine: 1.015 (ref 1.005–1.030)
Squamous Epithelial / LPF: NONE SEEN
pH: 5 (ref 5.0–8.0)

## 2017-03-11 LAB — CBC
HCT: 31.6 % — ABNORMAL LOW (ref 39.0–52.0)
HEMOGLOBIN: 10.7 g/dL — AB (ref 13.0–17.0)
MCH: 28.4 pg (ref 26.0–34.0)
MCHC: 33.9 g/dL (ref 30.0–36.0)
MCV: 83.8 fL (ref 78.0–100.0)
PLATELETS: 169 10*3/uL (ref 150–400)
RBC: 3.77 MIL/uL — AB (ref 4.22–5.81)
RDW: 14.8 % (ref 11.5–15.5)
WBC: 7.5 10*3/uL (ref 4.0–10.5)

## 2017-03-11 LAB — PHOSPHORUS: PHOSPHORUS: 5.2 mg/dL — AB (ref 2.5–4.6)

## 2017-03-11 LAB — C DIFFICILE QUICK SCREEN W PCR REFLEX
C DIFFICILE (CDIFF) INTERP: NOT DETECTED
C DIFFICILE (CDIFF) TOXIN: NEGATIVE
C Diff antigen: NEGATIVE

## 2017-03-11 LAB — MAGNESIUM: Magnesium: 2.4 mg/dL (ref 1.7–2.4)

## 2017-03-11 MED ORDER — POTASSIUM CHLORIDE IN NACL 40-0.9 MEQ/L-% IV SOLN
INTRAVENOUS | Status: DC
  Administered 2017-03-11: 125 mL/h via INTRAVENOUS
  Filled 2017-03-11 (×3): qty 1000

## 2017-03-11 MED ORDER — LORAZEPAM 2 MG/ML IJ SOLN
2.0000 mg | Freq: Once | INTRAMUSCULAR | Status: AC
  Administered 2017-03-11: 2 mg via INTRAVENOUS
  Filled 2017-03-11: qty 1

## 2017-03-11 MED ORDER — ACETAMINOPHEN 650 MG RE SUPP: 650.0000 mg | Freq: Four times a day (QID) | RECTAL | Status: DC | PRN

## 2017-03-11 MED ORDER — ADULT MULTIVITAMIN W/MINERALS CH
1.0000 | ORAL_TABLET | Freq: Every day | ORAL | Status: DC
  Administered 2017-03-14: 1 via ORAL
  Filled 2017-03-11 (×2): qty 1

## 2017-03-11 MED ORDER — SODIUM CHLORIDE 0.9 % IV BOLUS (SEPSIS)
1000.0000 mL | Freq: Once | INTRAVENOUS | Status: AC
  Administered 2017-03-11: 1000 mL via INTRAVENOUS

## 2017-03-11 MED ORDER — POTASSIUM CHLORIDE 10 MEQ/100ML IV SOLN
10.0000 meq | Freq: Once | INTRAVENOUS | Status: AC
  Administered 2017-03-11: 10 meq via INTRAVENOUS

## 2017-03-11 MED ORDER — ONDANSETRON HCL 4 MG PO TABS
4.0000 mg | ORAL_TABLET | Freq: Four times a day (QID) | ORAL | Status: DC | PRN
  Administered 2017-03-11: 4 mg via ORAL
  Filled 2017-03-11: qty 1

## 2017-03-11 MED ORDER — POTASSIUM CHLORIDE 10 MEQ/100ML IV SOLN
10.0000 meq | INTRAVENOUS | Status: AC
  Administered 2017-03-12 (×2): 10 meq via INTRAVENOUS
  Filled 2017-03-11: qty 100

## 2017-03-11 MED ORDER — POTASSIUM CHLORIDE 10 MEQ/100ML IV SOLN: 10.0000 meq | INTRAVENOUS | Status: AC

## 2017-03-11 MED ORDER — ONDANSETRON HCL 4 MG/2ML IJ SOLN: 4.0000 mg | Freq: Four times a day (QID) | INTRAMUSCULAR | Status: DC | PRN

## 2017-03-11 MED ORDER — ACETAMINOPHEN 325 MG PO TABS
650.0000 mg | ORAL_TABLET | Freq: Four times a day (QID) | ORAL | Status: DC | PRN
  Administered 2017-03-20: 650 mg via ORAL
  Filled 2017-03-11: qty 2

## 2017-03-11 MED ORDER — POTASSIUM CHLORIDE CRYS ER 10 MEQ PO TBCR
10.0000 meq | EXTENDED_RELEASE_TABLET | Freq: Two times a day (BID) | ORAL | Status: DC
  Administered 2017-03-11: 10 meq via ORAL
  Filled 2017-03-11 (×2): qty 1

## 2017-03-11 MED ORDER — ENOXAPARIN SODIUM 30 MG/0.3ML ~~LOC~~ SOLN
30.0000 mg | SUBCUTANEOUS | Status: DC
  Administered 2017-03-11 – 2017-03-16 (×5): 30 mg via SUBCUTANEOUS
  Filled 2017-03-11 (×5): qty 0.3

## 2017-03-11 MED ORDER — PROCHLORPERAZINE EDISYLATE 5 MG/ML IJ SOLN
10.0000 mg | Freq: Four times a day (QID) | INTRAMUSCULAR | Status: DC | PRN
  Administered 2017-03-11 – 2017-03-19 (×3): 10 mg via INTRAVENOUS
  Filled 2017-03-11 (×3): qty 2

## 2017-03-11 MED ORDER — POTASSIUM CHLORIDE 10 MEQ/100ML IV SOLN
10.0000 meq | INTRAVENOUS | Status: AC
  Administered 2017-03-11 (×2): 10 meq via INTRAVENOUS
  Filled 2017-03-11 (×3): qty 100

## 2017-03-11 NOTE — Progress Notes (Signed)
New Admission Note:  Arrival Method: Via stretcher from ED. Mental Orientation: Alert & Oriented x4 Telemetry: N/A Assessment: Completed Skin: Refer to flowsheet. IV: Right ForeArm Pain: 0/10 Tubes: Ostomy  Safety Measures: Safety Fall Prevention Plan discussed with patient. Admission: Completed 2 [place] Orientation: Patient has been orientated to the room, unit and the staff. Family: Wife and daughter at bedside.   Orders have been reviewed and implemented. Will continue to monitor the patient. Call light has been placed within reach.   Vassie Moselle, RN  Phone Number: [phone]

## 2017-03-11 NOTE — Progress Notes (Signed)
PROGRESS NOTE    Christopher Potts  KCL:[id number] DOB: 10-20-1945 DOA: 03/10/2017 PCP: Patient, No Pcp Per   Brief Narrative:  Christopher Potts is a 71 y.o. male with medical history significant of carcinoid tumor of the appendix, malignant who was admitted last week at [facility] for "SBO, treated conservatively, still having N/V" as of 9/10, with recommendation to f/u with oncology for progression of disease seen on CT.  His family reports that he is here with dehydration.  "We think it's because of the immune therapy I've had 3 times.  I was dehydrated down to nothing.  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  Started immunotherapy about 6 weeks ago, 1 treatment every other week (due Tuesday but he declined). He thinks the loose bowels and n/v with resultant dehydration started right after the immunotherapy.  "It tends to dry out everything".  Each treatment it got worse.  He was hospitaized last week in [place]; they stuck a tube down my throat and pumped out acid and put fluid in my veins.  Eventually they took the tube out and were giving liquids." Admitted for Intractable N/V with severe AKI from Dehydration and Hypovolemia.   Assessment & Plan:   Principal Problem:   Acute renal failure (ARF) (HCC) Active Problems:   Carcinoid tumor of appendix   Prolonged QT interval   Intractable nausea and vomiting   Hypokalemia  Acute Kidney Injury/Acute Renal Failure likely ATN from profound Hypovolemia, Dehydration, and Intractable Nausea and Vomiting  -Baseline creatinine is 1.   -Admission Creatinine is 4.12 and BUN is 86. -Repeat BUN/Cr worsened to 92/5.32  -Likely due to prerenal failure secondary to dehydration as evidenced by hyponatremia, contraction alkalosis. -Dehydration is likely related to poor PO intake and increased stool  output associated with appendiceal carcinoid -Given 3 Liters of NS today; Bolused 1 Liter of LR yesterday -LR at 125 changed to NS at 125 + 40 mEQ KCl  -Avoid ACEI and NSAIDs as well as other nephrotoxic medications -He does have known stable "marked left hydronephrosis" thought to be due to scarring around the proximal ureter; if renal function is not improving, he may require urology assistance for stenting -Checking STAT Abdominal U/S to evaluate Gallbladder and to evaluate his Hydronephrosis (per report has moderate to severe) -Ordering CT Abd/Pelvis w/o Contrast as well -Abdominal Flat Plate showed No radiographic evidence for acute cardiopulmonary abnormality. Scattered fluid levels but without definitive obstructive pattern identified. Possible pill fragment in the left upper quadrant -Repeating BMP this Evening, and Repeat CMP in AM -May benefit from Urology consultation if Hydronephrosis is worseon CT Scan. Discussed with Dr. Tresa Moore and stated to obtain that and if Hydronephrosis was there  -Strict I's/O's  Prolonged QT -Attempt to hold QT-prolonging agents -Anticipate improvement with rehydration -Replete K+/Mag++ as needed; K+ was low at 3.0 and currently being Replete -Repeat EKGs showing QTc of 530, 515 -Repeat BMP this Evening pending  -Repeat EKG this Evening and AM  Carcinoid tumor of the appendix, malignant -There is no apparent evidence of an SBO at this time -Last heme/onc office visit appears to have been 8/14 - he is in the process of changing to Dr. Benay Spice but has not seen him yet -At the 8/14 visit, there was discussion about possible involvement in the bladder wall with plan to see urology  in [place] due to moderate to severe hydronephrosis; the patient cancelled that appointment because "he has great difficulty traveling to [facility]" -He was then seen by Rad Onc on 8/21, who suggested that the patient return to medical oncology for systemic therapy -CT from 7/30  showed slight progression of abdominopelvic peritoneal disease with tumor involvement in the infraumbilical ventral abdominal wall and possible transmural extension of peritoneal tumor into the bladder; there was also neoplastic obstruction of the proximal to mid left ureter with moderate to severe L hydronephrosis and delayed kidney function; and progressive enlargement of diaphragmatic lymph nodes concerning for neoplastic involvement. -As of 9/11 it appears that his Oncologist recommended either going back on FOLFOX or opting for hospice and forgoing any further treatment.  The patient was not inclined to pursue hospice or stop treatment but he "did say he felt like he was losing ground with options." -He is unlikely to benefit from inpatient cancer treatment at this time. -Palliative Care Consulted and appreciate Evaluation  Intractable Nausea and Vomiting -C/w IVF Rehydration; Bolused 3 Liters of NS today and C/w IVF Maintenance 125 mL + KCl 40 mEQ  -Antiemetics limited by prolonged qTC -Will Try Lorazepam -Compazine 10 mg IV q6hprn for N/V and Refractory N/V  Hypokalemia -Patient's K+ was 3.0  -Replete with IV KCl 30 mEQ, IVF with KCl 40 mEQ, and with po KCl -Continue to Monitor and Replete as Necessary -Repeat BMP this evening pending   Severe Malnutrition in the Context of Chronic Illness -Nutritionist Consulted -C/w Ensure Enlive po BID if tolerated  DVT prophylaxis: Lovenox 40 mg sq q24h Code Status: FULL CODE Family Communication: Discussed Disposition Plan: Remain Inpatient for continued workup  Consultants:   Palliative Care  Discussed Case with Urology Dr. Tresa Moore   Procedures: None  Antimicrobials:  Anti-infectives    None     Subjective: Seen and examined at bedside and was still extremely nauseous and looked dehydrated. Patient was continually nauseous and vomiting all day and stated that his chest was sore from vomiting so much.   Objective: Vitals:    03/11/17 0100 03/11/17 0221 03/11/17 0538 03/11/17 1000  BP: 98/71 (!) 94/58 (!) 157/47 (!) 164/54  Pulse: 100 100 95 93  Resp: 13 20 19 20   Temp:  97.7 F (36.5 C) 97.9 F (36.6 C) 97.8 F (36.6 C)  TempSrc:  Oral Oral Oral  SpO2: 98% 98% 95% 96%  Weight:  55.5 kg (122 lb 4.8 oz)    Height:  6\' 1"  (1.854 m)      Intake/Output Summary (Last 24 hours) at 03/11/17 1834 Last data filed at 03/11/17 1509  Gross per 24 hour  Intake          2488.75 ml  Output              800 ml  Net          1688.75 ml   Filed Weights   03/11/17 0221  Weight: 55.5 kg (122 lb 4.8 oz)   Examination: Physical Exam:  Constitutional: Thin cachectic frail and chronically ill appearing Caucasian male in some distress vomiting  Eyes: Lids and conjunctivae normal, sclerae anicteric  ENMT: External Ears, Nose appear normal. Grossly normal hearing. Mucous membranes are dry. Neck: Appears normal, supple, no cervical masses, normal ROM, no appreciable thyromegaly, no JVD Respiratory: Diminished to auscultation bilaterally, no wheezing, rales, rhonchi or crackles. Normal respiratory effort and patient is not tachypenic. No accessory muscle use.  Cardiovascular: RRR, no murmurs /  rubs / gallops. S1 and S2 auscultated. No extremity edema. Abdomen: Soft, slightly tender, non-distended. Bowel sounds positive. Has a Colostomy in place GU: Deferred. Musculoskeletal: No clubbing / cyanosis of digits/nails. No joint deformity upper and lower extremities.  Skin: No rashes, lesions, ulcers on a limited skin eval. No induration; Warm and dry.  Neurologic: CN 2-12 grossly intact with no focal deficits. Romberg sign cerebellar reflexes not assessed.  Psychiatric: Normal judgment and insight. Alert and oriented x 3. Depressed appearing mood and flat affect.   Data Reviewed: I have personally reviewed following labs and imaging studies  CBC:  Recent Labs Lab 03/10/17 1603 03/11/17 0713  WBC 6.8 7.5  HGB 12.8* 10.7*    HCT 38.6* 31.6*  MCV 84.3 83.8  PLT 239 431   Basic Metabolic Panel:  Recent Labs Lab 03/10/17 1603 03/11/17 0713 03/11/17 0830  NA 129* 131*  --   K 3.6 3.0*  --   CL 72* 76*  --   CO2 41* 38*  --   GLUCOSE 128* 101*  --   BUN 86* 92*  --   CREATININE 4.12* 5.36*  --   CALCIUM 9.2 8.7*  --   MG  --  2.4  --   PHOS  --   --  5.2*   GFR: Estimated Creatinine Clearance: 9.9 mL/min (A) (by C-G formula based on SCr of 5.36 mg/dL (H)). Liver Function Tests: No results for input(s): AST, ALT, ALKPHOS, BILITOT, PROT, ALBUMIN in the last 168 hours. No results for input(s): LIPASE, AMYLASE in the last 168 hours. No results for input(s): AMMONIA in the last 168 hours. Coagulation Profile: No results for input(s): INR, PROTIME in the last 168 hours. Cardiac Enzymes: No results for input(s): CKTOTAL, CKMB, CKMBINDEX, TROPONINI in the last 168 hours. BNP (last 3 results) No results for input(s): PROBNP in the last 8760 hours. HbA1C: No results for input(s): HGBA1C in the last 72 hours. CBG: No results for input(s): GLUCAP in the last 168 hours. Lipid Profile: No results for input(s): CHOL, HDL, LDLCALC, TRIG, CHOLHDL, LDLDIRECT in the last 72 hours. Thyroid Function Tests: No results for input(s): TSH, T4TOTAL, FREET4, T3FREE, THYROIDAB in the last 72 hours. Anemia Panel: No results for input(s): VITAMINB12, FOLATE, FERRITIN, TIBC, IRON, RETICCTPCT in the last 72 hours. Sepsis Labs: No results for input(s): PROCALCITON, LATICACIDVEN in the last 168 hours.  Recent Results (from the past 240 hour(s))  C difficile quick scan w PCR reflex     Status: None   Collection Time: 03/10/17 11:55 PM  Result Value Ref Range Status   C Diff antigen NEGATIVE NEGATIVE Final   C Diff toxin NEGATIVE NEGATIVE Final   C Diff interpretation No C. difficile detected.  Final    Radiology Studies: Dg Abdomen Acute W/chest  Result Date: 03/10/2017 CLINICAL DATA:  Fatigue and weakness EXAM: DG  ABDOMEN ACUTE W/ 1V CHEST COMPARISON:  CT 06/07/2015, radiograph 05/29/2015, CT 01/06/2017 FINDINGS: Single view chest demonstrates a left-sided central venous port with the tip overlying the upper SVC. No acute consolidation or effusion. Normal cardiomediastinal silhouette. No pneumothorax. Supine and upright views of the abdomen demonstrate no free air beneath the diaphragm. Postsurgical changes in the right hemiabdomen and pelvis. Calcified phleboliths in the pelvis. Scattered fluid levels on the upright study with overall nonobstructed gas pattern. Oval opacity in the left upper quadrant could reflect a pill fragment. IMPRESSION: 1. No radiographic evidence for acute cardiopulmonary abnormality. 2. Scattered fluid levels but without definitive obstructive  pattern identified 3. Possible pill fragment in the left upper quadrant Electronically Signed   By: Donavan Foil M.D.   On: 03/10/2017 20:56   Scheduled Meds: . enoxaparin (LOVENOX) injection  30 mg Subcutaneous Q24H  . feeding supplement (ENSURE ENLIVE)  237 mL Oral BID BM  . multivitamin with minerals  1 tablet Oral Daily  . potassium chloride  10 mEq Oral BID   Continuous Infusions: . 0.9 % NaCl with KCl 40 mEq / L 125 mL/hr (03/11/17 1351)  . potassium chloride    . sodium chloride      LOS: 1 day   Kerney Elbe, DO [facility] Pager [phone]  If 7PM-7AM, please contact night-coverage [email or web address] Password [facility] 03/11/2017, 6:34 PM

## 2017-03-11 NOTE — Progress Notes (Signed)
Initial Nutrition Assessment  DOCUMENTATION CODES:   Severe malnutrition in context of chronic illness, Underweight  INTERVENTION:   -Encourage po as tolerated  -Continue Ensure Enlive po BID, each supplement provides 350 kcal and 20 grams of protein  -If unable continues to be unable to tolerate po, recommend initiation of nutrition support if poc warrants   NUTRITION DIAGNOSIS:   Malnutrition (Severe) related to chronic illness, cancer and cancer related treatments as evidenced by severe depletion of muscle mass, severe depletion of body fat.  GOAL:   Patient will meet greater than or equal to 90% of their needs  MONITOR:   PO intake, Supplement acceptance, Labs, Weight trends, Diet advancement  REASON FOR ASSESSMENT:   Malnutrition Screening Tool    ASSESSMENT:   71 yo male admitted with ARF likely related to dehydration from poor PO intake and increase stool output associated with appendiceal cancer. Pt diagnosed with appendiceal cancer 05/2015. Recent CT showed progression of peritoneal mets. Noted pt admitted with [facility] last week for an SBO that was treated medically. Pt started immunotherapy 6 weeks ago.   Per MD notes, no apparent evidenced of SBO at this time  Pt miserable on visit today, very nauseated, emesis x 2. Not tolerating po at present. Unable to get a very good diet history. Pt reports N/V/D at least since starting immunotherapy weeks ago.   Pt reports significant wt loss. Reports he weighed 200 pounds in March of 2017 (39% wt loss). Current wt of 122 pounds, pt is underweight.   Nutrition-Focused physical exam completed. Findings are severe fat depletion, severe muscle depletion, and no edema.   Labs: sodium 131 (L), potassium 3.0 (L), phosphorus 5.2 (H), Creatinine 5.36 Meds: LR at 180ml/hr, MVI, KCL  Diet Order:  DIET SOFT Room service appropriate? Yes; Fluid consistency: Thin  Skin:  Reviewed, no issues  Last BM:  +stool  in osotmy bag  Height:   Ht Readings from Last 1 Encounters:  03/11/17 6\' 1"  (1.854 m)    Weight:   Wt Readings from Last 1 Encounters:  03/11/17 122 lb 4.8 oz (55.5 kg)    Ideal Body Weight:  83.6 kg  BMI:  Body mass index is 16.14 kg/m.  Estimated Nutritional Needs:   Kcal:  2297-9892 kcals  Protein:  83-97  Fluid:  >/= 1.7 L  EDUCATION NEEDS:   Education needs no appropriate at this time  [place], [place], LDN [phone] Pager  [phone] Weekend/On-Call Pager

## 2017-03-11 NOTE — Progress Notes (Signed)
Pt coming back from CT via bed with transporter,Transporter stated"pt c/o chest pain while in the elevator"EKG done showed ST 104 possible Inferior infarct vital signs 95/63,pt stated he had chest pressure with the chest pain while in the elevator.PT did  not have chest pain going into his room,2 liters applied [place] to patient ,rapid response notified,lab notified to draw troponin will continue to monitor

## 2017-03-11 NOTE — Progress Notes (Signed)
Per MD orders, potassium chloride held, waiting for BMP to result. Night shift RN made aware that night shift MD is to be made aware of results to determine if ordered potassium is to be held. 3rd EKG ordered for night shift.

## 2017-03-11 NOTE — ED Notes (Signed)
Pt was transferred up to 2w.  Discussed this with family and pt.  Pt is alert and oriented, no acute distress.  Report was given to RN on 2W

## 2017-03-11 NOTE — ED Notes (Signed)
Pt was helped to the bathroom to void by EMT.  While in the bathroom pt vomited.  EMT observed how pt stuck his finger back in his throat to make himself vomit. I asked pt about this and he states that he did as this sometimes makes him feel better.  Pt also does not weigh 202lbs.  Pt weighs around 130lbs.  Report was given to 2W

## 2017-03-11 NOTE — Progress Notes (Signed)
Pateint's n/v continue despite antiemetics. MD is aware. Unable to tolerate anything PO

## 2017-03-11 NOTE — ED Notes (Signed)
Pt and family made aware of bed assignment 

## 2017-03-12 ENCOUNTER — Telehealth: Payer: Self-pay

## 2017-03-12 ENCOUNTER — Inpatient Hospital Stay (HOSPITAL_COMMUNITY): Payer: Medicare HMO

## 2017-03-12 DIAGNOSIS — Z515 Encounter for palliative care: Secondary | ICD-10-CM

## 2017-03-12 DIAGNOSIS — Z7189 Other specified counseling: Secondary | ICD-10-CM

## 2017-03-12 LAB — COMPREHENSIVE METABOLIC PANEL
ALBUMIN: 2.8 g/dL — AB (ref 3.5–5.0)
ALK PHOS: 51 U/L (ref 38–126)
ALT: 20 U/L (ref 17–63)
AST: 40 U/L (ref 15–41)
Anion gap: 16 — ABNORMAL HIGH (ref 5–15)
BILIRUBIN TOTAL: 1.6 mg/dL — AB (ref 0.3–1.2)
BUN: 97 mg/dL — AB (ref 6–20)
CALCIUM: 8.5 mg/dL — AB (ref 8.9–10.3)
CO2: 33 mmol/L — ABNORMAL HIGH (ref 22–32)
Chloride: 86 mmol/L — ABNORMAL LOW (ref 101–111)
Creatinine, Ser: 6.33 mg/dL — ABNORMAL HIGH (ref 0.61–1.24)
GFR calc Af Amer: 9 mL/min — ABNORMAL LOW (ref >60)
GFR calc non Af Amer: 8 mL/min — ABNORMAL LOW (ref >60)
GLUCOSE: 116 mg/dL — AB (ref 65–99)
Potassium: 5.3 mmol/L — ABNORMAL HIGH (ref 3.5–5.1)
Sodium: 135 mmol/L (ref 135–145)
TOTAL PROTEIN: 5.4 g/dL — AB (ref 6.5–8.1)

## 2017-03-12 LAB — CBC WITH DIFFERENTIAL/PLATELET
Basophils Absolute: 0 10*3/uL (ref 0.0–0.1)
Basophils Relative: 0 %
EOS PCT: 0 %
Eosinophils Absolute: 0 10*3/uL (ref 0.0–0.7)
HEMATOCRIT: 36.6 % — AB (ref 39.0–52.0)
HEMOGLOBIN: 12.1 g/dL — AB (ref 13.0–17.0)
Lymphocytes Relative: 7 %
Lymphs Abs: 0.4 10*3/uL — ABNORMAL LOW (ref 0.7–4.0)
MCH: 28.1 pg (ref 26.0–34.0)
MCHC: 33.1 g/dL (ref 30.0–36.0)
MCV: 85.1 fL (ref 78.0–100.0)
MONOS PCT: 2 %
Monocytes Absolute: 0.1 10*3/uL (ref 0.1–1.0)
Neutro Abs: 5 10*3/uL (ref 1.7–7.7)
Neutrophils Relative %: 91 %
Platelets: 166 10*3/uL (ref 150–400)
RBC: 4.3 MIL/uL (ref 4.22–5.81)
RDW: 15 % (ref 11.5–15.5)
WBC Morphology: INCREASED
WBC: 5.5 10*3/uL (ref 4.0–10.5)

## 2017-03-12 LAB — PHOSPHORUS: Phosphorus: 4.7 mg/dL — ABNORMAL HIGH (ref 2.5–4.6)

## 2017-03-12 LAB — TROPONIN I: Troponin I: 0.08 ng/mL (ref <0.03)

## 2017-03-12 LAB — MAGNESIUM: Magnesium: 2.1 mg/dL (ref 1.7–2.4)

## 2017-03-12 MED ORDER — MORPHINE SULFATE (PF) 2 MG/ML IV SOLN
2.0000 mg | Freq: Once | INTRAVENOUS | Status: AC
  Administered 2017-03-12: 2 mg via INTRAVENOUS
  Filled 2017-03-12: qty 1

## 2017-03-12 MED ORDER — SODIUM CHLORIDE 0.9 % IV SOLN
INTRAVENOUS | Status: DC
  Administered 2017-03-12 – 2017-03-14 (×6): via INTRAVENOUS

## 2017-03-12 MED ORDER — GI COCKTAIL ~~LOC~~
30.0000 mL | Freq: Once | ORAL | Status: AC
  Administered 2017-03-12: 30 mL via ORAL
  Filled 2017-03-12: qty 30

## 2017-03-12 MED ORDER — CEFAZOLIN (ANCEF) 1 G IV SOLR: 1.0000 g | INTRAVENOUS | Status: DC

## 2017-03-12 MED ORDER — CEFAZOLIN SODIUM-DEXTROSE 2-4 GM/100ML-% IV SOLN
2.0000 g | Freq: Once | INTRAVENOUS | Status: AC
Start: 2017-03-13 — End: 2017-03-13
  Administered 2017-03-13: 2 g via INTRAVENOUS
  Filled 2017-03-12: qty 100

## 2017-03-12 NOTE — Progress Notes (Signed)
I have posted the case with the OR here, and it will possibly be performed about 9 o'clock.

## 2017-03-12 NOTE — Progress Notes (Signed)
Pt refusing NG tube. He states he is comfortable at the present time. Dr. Zigmund Daniel paged to be made aware.

## 2017-03-12 NOTE — Progress Notes (Signed)
PROGRESS NOTE    Christopher Potts  LFY:[id number] DOB: 1946/05/06 DOA: 03/10/2017 PCP: Patient, No Pcp Per  71 y.o.malewith medical history significant of carcinoid tumor of the appendix, malignant who was admitted last week at [facility] for "SBO, treated conservatively, still having N/V" as of 9/10, with recommendation to f/u with oncology for progression of disease seen on CT. His family reports that he is here with dehydration. "We think it's because of the immune therapy I've had 3 times. I was dehydrated down to nothing. 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 Started immunotherapy about 6 weeks ago, 1 treatmentevery other week (due Tuesday but he declined). He thinks the loose bowels and n/v with resultant dehydrationstarted right after the immunotherapy. "It tends to dry out everything". Each treatment it got worse. He was hospitaized last week in [place]; they stuck a tube down my throat and pumped out acid and put fluid in my veins. Eventually they took the tube out and were giving liquids." Admitted for Intractable N/V with severe AKI from Dehydration and Hypovolemia. Abdomen and pelvis done yesterday shows distended and fluid-filled distally saphenous, stomach and proximal small bowel indicates he wore for obstruction. Tiny right pleural effusion. Bilateral kidney stones bilateral hydronephrosis. Possible bladder wall thickening. Renal ultrasound shows moderate left-sided hydronephrosis mild right-sided hydronephrosis with decompressed bladder. Left renal stone measures 1.7 cm. Gallstones seen without evidence of acute cholecystitis.   Assessment & Plan:   Principal Problem:   Acute renal failure (ARF) (HCC) Active Problems:   Carcinoid tumor of appendix   Prolonged QT interval   Nausea & vomiting   Hypokalemia   DNR  (do not resuscitate) discussion   Palliative care by specialist  Acute renal failure with a baseline creatinine of 1.0 now creatinine up to 6.3. Initial thought was dehydration and hypovolemia which he probably is stable. Workup showed bilateral hydronephrosis. Consults were placed to urology. Follow-up BMP tomorrow.  Malignant carcinoid tumor of the appendix followed by oncologist as an outpatient at [facility]. Discussed with his primary oncologist Dr. Gerri Spore  impression was this patient needs palliative care and hospice consult.  Intractable nausea vomiting secondary to bowel obstruction. Patient refused NG tube placement.  Bilateral hydronephrosis probably secondary to scarring versus tumor burden. await urology consult for possible stenting.        DVT prophylaxis: Th Code Status:  Family Communication:  Disposition Plan:    Consultant  Procedures:   Antimicrobials   Subjective:   Objective: Vitals:   03/11/17 1954 03/11/17 2338 03/12/17 0432 03/12/17 0806  BP: 106/77 95/63 100/67 113/66  Pulse: (!) 117 (!) 105 99 (!) 104  Resp: 18 17 16 20   Temp: 97.8 F (36.6 C) 98.1 F (36.7 C) 97.8 F (36.6 C) 97.9 F (36.6 C)  TempSrc:    Oral  SpO2: 92% 96% 94% 94%  Weight: 57.6 kg (127 lb)     Height:        Intake/Output Summary (Last 24 hours) at 03/12/17 1728 Last data filed at 03/12/17 1612  Gross per 24 hour  Intake              225 ml  Output             1125 ml  Net             -900 ml  Filed Weights   03/11/17 0221 03/11/17 1954  Weight: 55.5 kg (122 lb 4.8 oz) 57.6 kg (127 lb)    Examination:  General exam: Appears calm and comfortable  Respiratory system: Clear to auscultation. Respiratory effort normal. Cardiovascular system: S1 & S2 heard, RRR. No JVD, murmurs, rubs, gallops or clicks. No pedal edema. Gastrointestinal system: Abdomen is nondistended, soft and nontender. No organomegaly or masses felt. Normal bowel sounds  heard. Central nervous system: Alert and oriented. No focal neurological deficits. Extremities: Symmetric 5 x 5 power. Skin: No rashes, lesions or ulcers Psychiatry: Judgement and insight appear normal. Mood & affect appropriate.     Data Reviewed: I have personally reviewed following labs and imaging studies  CBC:  Recent Labs Lab 03/10/17 1603 03/11/17 0713 03/12/17 0517  WBC 6.8 7.5 5.5  NEUTROABS  --   --  5.0  HGB 12.8* 10.7* 12.1*  HCT 38.6* 31.6* 36.6*  MCV 84.3 83.8 85.1  PLT 239 169 008   Basic Metabolic Panel:  Recent Labs Lab 03/10/17 1603 03/11/17 0713 03/11/17 0830 03/11/17 1830 03/12/17 0517  NA 129* 131*  --  133* 135  K 3.6 3.0*  --  3.6 5.3*  CL 72* 76*  --  79* 86*  CO2 41* 38*  --  39* 33*  GLUCOSE 128* 101*  --  122* 116*  BUN 86* 92*  --  90* 97*  CREATININE 4.12* 5.36*  --  6.23* 6.33*  CALCIUM 9.2 8.7*  --  8.6* 8.5*  MG  --  2.4  --   --  2.1  PHOS  --   --  5.2*  --  4.7*   GFR: Estimated Creatinine Clearance: 8.7 mL/min (A) (by C-G formula based on SCr of 6.33 mg/dL (H)). Liver Function Tests:  Recent Labs Lab 03/12/17 0517  AST 40  ALT 20  ALKPHOS 51  BILITOT 1.6*  PROT 5.4*  ALBUMIN 2.8*   No results for input(s): LIPASE, AMYLASE in the last 168 hours. No results for input(s): AMMONIA in the last 168 hours. Coagulation Profile: No results for input(s): INR, PROTIME in the last 168 hours. Cardiac Enzymes:  Recent Labs Lab 03/12/17 0007  TROPONINI 0.08*   BNP (last 3 results) No results for input(s): PROBNP in the last 8760 hours. HbA1C: No results for input(s): HGBA1C in the last 72 hours. CBG: No results for input(s): GLUCAP in the last 168 hours. Lipid Profile: No results for input(s): CHOL, HDL, LDLCALC, TRIG, CHOLHDL, LDLDIRECT in the last 72 hours. Thyroid Function Tests: No results for input(s): TSH, T4TOTAL, FREET4, T3FREE, THYROIDAB in the last 72 hours. Anemia Panel: No results for input(s):  VITAMINB12, FOLATE, FERRITIN, TIBC, IRON, RETICCTPCT in the last 72 hours. Sepsis Labs: No results for input(s): PROCALCITON, LATICACIDVEN in the last 168 hours.  Recent Results (from the past 240 hour(s))  C difficile quick scan w PCR reflex     Status: None   Collection Time: 03/10/17 11:55 PM  Result Value Ref Range Status   C Diff antigen NEGATIVE NEGATIVE Final   C Diff toxin NEGATIVE NEGATIVE Final   C Diff interpretation No C. difficile detected.  Final         Radiology Studies: Ct Abdomen Pelvis Wo Contrast  Result Date: 03/12/2017 CLINICAL DATA:  Shortness of breath, nausea and vomiting. Appendiceal carcinoid. Loose bowels. EXAM: CT ABDOMEN AND PELVIS WITHOUT CONTRAST TECHNIQUE: Multidetector CT imaging of the abdomen and pelvis was performed following the standard protocol without IV contrast.  COMPARISON:  01/06/2017. FINDINGS: Lower chest: Peribronchovascular ground-glass and consolidation in the lingula and both lower lobes. Tiny right pleural effusion. Heart size normal. No pericardial effusion. Distal esophagus is dilated and fluid-filled. Hepatobiliary: Liver and gallbladder are grossly unremarkable. A 1.7 x 3.4 cm low-attenuation collection along the posterior border of the right hepatic lobe is likely a subcapsular fluid collection. There is significant streak artifact from the patient's arms. No definite biliary ductal dilatation. Pancreas: Grossly unremarkable. Spleen: Negative. Adrenals/Urinary Tract: There may be slight thickening of the right adrenal gland. Left adrenal gland is grossly unremarkable. Small stones in the kidneys. Moderate right hydronephrosis without cause identified. Persistent severe left hydronephrosis. Left ureter is difficult to follow without IV contrast. Bladder is low in volume with possible wall thickening. Perivesical stranding and fluid, progressive. Stomach/Bowel: Stomach and proximal small bowel are distended with fluid. Horizontal portion of  the duodenum measures up to 4.4 cm. There may be a jejunal transition point in the left lower quadrant. Left lower quadrant colostomy. Right and left hemicolectomies with a left lower quadrant colostomy. Rectosigmoid colon appears tethered to the sacrum and may be postoperative in etiology. Vascular/Lymphatic: Mild atherosclerotic calcification of the aorta. No definite pathologically enlarged lymph nodes. Reproductive: Prostate is visualized. Other: Presacral edema. Small left inguinal hernia contains fat. No free fluid. Musculoskeletal: No worrisome lytic or sclerotic lesions. Degenerative changes in the spine. IMPRESSION: 1. Distended and fluid-filled distal esophagus, stomach and proximal small bowel, indicative of obstruction, with a possible jejunal transition point in the left lower quadrant. 2. Lingular and bilateral lower lobe ground-glass and consolidation may be due to aspiration or pneumonia. 3. Tiny right pleural effusion. 4. Low-attenuation along the posterior right hepatic lobe is suspicious for a small subcapsular fluid collection. 5. Bilateral renal stones. Bilateral hydronephrosis, etiology indeterminate. 6. Low volume bladder with possible wall thickening. Perivesical edema and stranding. Difficult to exclude cystitis. 7.  Aortic atherosclerosis (ICD10-170.0). Electronically Signed   By: Lorin Picket M.D.   On: 03/12/2017 08:58   US Abdomen Complete  Result Date: 03/11/2017 CLINICAL DATA:  Nausea and vomiting. EXAM: ABDOMEN ULTRASOUND COMPLETE COMPARISON:  None. FINDINGS: Gallbladder: 15 mm gallstone. No gallbladder wall thickening, pericholecystic fluid or other secondary signs of acute cholecystitis. No sonographic Murphy's sign. Common bile duct: Diameter: Normal at 3 mm. Liver: No focal lesion identified. Within normal limits in parenchymal echogenicity. Portal vein is patent on color Doppler imaging with normal direction of blood flow towards the liver. IVC: No abnormality visualized.  Pancreas: Visualized portion unremarkable. Spleen: Obscured by the overlying stomach which appears to be distended with recently ingested material. Right Kidney: Length: 11.3 cm. Mild hydronephrosis. Cortical thickness and echogenicity is within normal limits without mass. No renal stones seen. Left Kidney: Length: 12.8 cm. Moderate hydronephrosis. Cortical thickness and echogenicity is within normal limits. No mass. Lower pole left renal stone measures 1.7 cm greatest dimension. Abdominal aorta: No aneurysm visualized. Other findings: Bladder is decompressed. IMPRESSION: 1. Moderate left-sided hydronephrosis. Mild right-sided hydronephrosis. Consider CT abdomen and pelvis for further characterization. Bladder is decompressed. Left renal stone measures 1.7 cm. 2. Cholelithiasis without evidence of acute cholecystitis. No bile duct dilatation. 3. Spleen is obscured by the overlying stomach which appears to be distended with recently ingested fluid/material. Electronically Signed   By: Franki Cabot M.D.   On: 03/11/2017 23:27   Dg Abdomen Acute W/chest  Result Date: 03/10/2017 CLINICAL DATA:  Fatigue and weakness EXAM: DG ABDOMEN ACUTE W/ 1V CHEST COMPARISON:  CT 06/07/2015,  radiograph 05/29/2015, CT 01/06/2017 FINDINGS: Single view chest demonstrates a left-sided central venous port with the tip overlying the upper SVC. No acute consolidation or effusion. Normal cardiomediastinal silhouette. No pneumothorax. Supine and upright views of the abdomen demonstrate no free air beneath the diaphragm. Postsurgical changes in the right hemiabdomen and pelvis. Calcified phleboliths in the pelvis. Scattered fluid levels on the upright study with overall nonobstructed gas pattern. Oval opacity in the left upper quadrant could reflect a pill fragment. IMPRESSION: 1. No radiographic evidence for acute cardiopulmonary abnormality. 2. Scattered fluid levels but without definitive obstructive pattern identified 3. Possible pill  fragment in the left upper quadrant Electronically Signed   By: Donavan Foil M.D.   On: 03/10/2017 20:56        Scheduled Meds: . enoxaparin (LOVENOX) injection  30 mg Subcutaneous Q24H  . multivitamin with minerals  1 tablet Oral Daily   Continuous Infusions: . sodium chloride 125 mL/hr at 03/12/17 1015     LOS: 2 days        Georgette Shell, MD [facility]  If 7PM-7AM, please contact night-coverage [email or web address] Password [facility] 03/12/2017, 5:28 PM
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
## 2017-03-12 NOTE — Progress Notes (Signed)
RN called for new onset of chest pain as he was returning from [place]. I was unable to come to bedside at that time. I placed an order for Troponin, EKG completed prior to RN calling RRT. SBP 90's. Paged Schorr NP Additional orders for Morphine 2mg  IVP, GI cocktail, and NS Bolus given and completed by Leeann Must. 0400 Follow up at bedside, pt in CT. RN reports pt has been resting denied any ongoing chest pain

## 2017-03-12 NOTE — Care Management Note (Addendum)
Case Management Note  Patient Details  Name: Christopher Potts MRN: [id number] Date of Birth: Apr 18, 1946  Subjective/Objective:   ARF, carcinoid tumor of appendix, nausea, vomiting                 Action/Plan: Discharge Planning: Spoke to pt and wife, [name] at bedside. States they are concerned about hospital bills. States they got assistance when he was [facility]. Explained once he receives bills he can apply for assistance with billing dept or payment plan. Pt has RW and tub bench at home. Had [facility] in the past. Pt may possibly need oxygen for home. Will continue to follow for dc needs.   PCP [name] MD  Expected Discharge Date:                Expected Discharge Plan:  [facility]  In-House Referral:  Clinical Social Work  Discharge planning Services  CM Consult  Post Acute Care Choice:  Home Health Choice offered to:  Spouse, Patient  DME Arranged:    DME Agency:     HH Arranged:    [place] Agency:  [facility] [employer] [facility] [employer]  Status of Service:  In process, will continue to follow  If discussed at Long Length of Stay Meetings, dates discussed:    Additional Comments:  Erenest Rasher, RN 03/12/2017, 7:40 PM

## 2017-03-12 NOTE — Consult Note (Signed)
Consultation Note Date: 03/12/2017   Patient Name: Christopher Potts  DOB: Jun 21, 1946  MRN: [id number]  Age / Sex: 71 y.o., male  PCP: Patient, No Pcp Per Referring Physician: Georgette Shell, MD  Reason for Consultation: Establishing goals of care and Psychosocial/spiritual support  HPI/Patient Profile: 71 y.o. male  admitted on 03/10/2017 with past medical history significant for Metastatic  appendiceal cancer with peritoneal carcinomatosis, Diagnosed with appendiceal cancer in Dec 2016.  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 who was admitted last week at [facility] for SBO, he was  treated conservatively, discharged still having N/V  with recommendation to f/u with oncology for progression of disease seen on CT.   Admitted with nausea, vomiting, and inability to keep his fluids down. Creatinine was 4.12 from a normal baseline. Patient was given aggressive hydration with LR and then normal saline but kidney function has continued to worsen  Renal ultrasound yesterday with left moderate hydronephrosis and new finding of right mild hydronephrosis. CT of the abdomen this morning demonstrated likely recurrent small bowel obstruction with bilateral hydronephrosis and decompressed bladder. There is also concern for a bladder mass. CT was performed without contrast.  Patient has had minimal urine output, anuric, since admission. Potassium is now 5.3.  Per EMR was seen by Dr Shary Decamp at [facility] in the past  Patient was scheduled  to see Dr Benay Spice as OP to morrow.  Plan is for Dr. Benay Spice to see the patient in the hospital tomorrow morning.   They are hopeful to receive information on viable treatment options. Patient and family are hopeful to restart FOLFOX.  Patient has continued to decline physically and functionally over the past several weeks. He and his family face treatment option decisions,  advanced directive decisions, and anticipatory care needs.    Clinical Assessment and Goals of Care:  This NP Wadie Lessen reviewed medical records, received report from team, assessed the patient and then meet at the patient's bedside along with his wife, daughter, and son-in-law  to discuss diagnosis, prognosis, GOC, EOL wishes disposition and options.  A detailed discussion was had today regarding advanced directives.  Concepts specific to code status, was had.  The difference between a aggressive medical intervention path  and a palliative comfort care path for this patient at this time was had.  Values and goals of care important to patient and family were attempted to be elicited.  Concept of Palliative Care was discussed    Questions and concerns addressed.   Family encouraged to call with questions or concerns.  PMT will continue to support holistically.   PATIENT  Is making his own decisions with the support of his family. There is no documentation of healthcare power of attorney or living will   SUMMARY OF RECOMMENDATIONS    Code Status/Advance Care Planning:  Full code-patient is encouraged to consider DNR/DNI status knowing poor outcomes in similar patients.  Palliative Prophylaxis:   Aspiration, Bowel Regimen, Delirium Protocol, Frequent Pain Assessment and Oral Care  Additional Recommendations (Limitations,  Scope, Preferences):  Full Scope Treatment  Psycho-social/Spiritual:   Desire for further Chaplaincy support:no  Prognosis:   < 6 months  Discharge Planning: To Be Determined      Primary Diagnoses: Present on Admission: . Acute renal failure (ARF) ([place]) . Carcinoid tumor of appendix . Prolonged QT interval   I have reviewed the medical record, interviewed the patient and family, and examined the patient. The following aspects are pertinent.  Past Medical History:  Diagnosis Date  . Blood transfusion 09/2010  . Constipation    due to pain med  .  GERD (gastroesophageal reflux disease)   . Heart murmur    had since 1974  . PONV (postoperative nausea and vomiting)    Social History   Social History  . Marital status: Married    Spouse name: N/A  . Number of children: N/A  . Years of education: N/A   Occupational History  . retired    Social History Main Topics  . Smoking status: Never Smoker  . Smokeless tobacco: Never Used  . Alcohol use 1.2 oz/week    2 Cans of beer per week  . Drug use: No  . Sexual activity: Yes    Birth control/ protection: Surgical   Other Topics Concern  . None   Social History Narrative  . None   Family History  Problem Relation Age of Onset  . Anesthesia problems Mother    Scheduled Meds: . enoxaparin (LOVENOX) injection  30 mg Subcutaneous Q24H  . multivitamin with minerals  1 tablet Oral Daily   Continuous Infusions: . sodium chloride 125 mL/hr at 03/12/17 1015   PRN Meds:.acetaminophen **OR** acetaminophen, prochlorperazine Medications Prior to Admission:  Prior to Admission medications   Medication Sig Start Date End Date Taking? Authorizing Provider  MAGNESIUM PO Take 1 tablet by mouth daily.   Yes [provider]  Multiple Vitamin (MULITIVITAMIN WITH MINERALS) TABS Take 1 tablet by mouth daily.   Yes [provider]  potassium chloride (K-DUR,KLOR-CON) 10 MEQ tablet Take 10 mEq by mouth 2 (two) times daily.   Yes [provider]  ranitidine (ZANTAC) 75 MG tablet Take 75 mg by mouth daily as needed. For acid reflux   Yes [provider]   Allergies  Allergen Reactions  . Fish Allergy Swelling    When eat fish and strawberries together but can eat separately   . Strawberry Extract     When eaten with Fish, but can have separately    Review of Systems  Gastrointestinal: Positive for nausea.    Physical Exam  Constitutional: He appears lethargic. He appears cachectic. He appears ill.  Cardiovascular: Tachycardia present.     Musculoskeletal:  Generalized weakness and atrophy  Neurological: He appears lethargic.  Skin: Skin is warm and dry.    Vital Signs: BP 113/66 (BP Location: Left Arm)   Pulse (!) 104   Temp 97.9 F (36.6 C) (Oral)   Resp 20   Ht 6\' 1"  (1.854 m)   Wt 57.6 kg (127 lb)   SpO2 94%   BMI 16.76 kg/m  Pain Assessment: No/denies pain       SpO2: SpO2: 94 % O2 Device:SpO2: 94 % O2 Flow Rate: .O2 Flow Rate (L/min): 2 L/min  IO: Intake/output summary:  Intake/Output Summary (Last 24 hours) at 03/12/17 1519 Last data filed at 03/12/17 0936  Gross per 24 hour  Intake              225 ml  Output              125 ml  Net              100 ml    LBM:   Baseline Weight: Weight: 55.5 kg (122 lb 4.8 oz) Most recent weight: Weight: 57.6 kg (127 lb)     Palliative Assessment/Data: 30 % at best   Discussed with Dr Zigmund Daniel  Time In: 1600 Time Out: 1700 Time Total: 60 min Greater than 50%  of this time was spent counseling and coordinating care related to the above assessment and plan. Palliative medicine team will continue to support holistically.  Signed by: Wadie Lessen, NP   Please contact Palliative Medicine Team phone at [phone] for questions and concerns.  For individual provider: See Shea Evans
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
## 2017-03-13 ENCOUNTER — Inpatient Hospital Stay (HOSPITAL_COMMUNITY): Payer: Medicare HMO

## 2017-03-13 ENCOUNTER — Inpatient Hospital Stay (HOSPITAL_COMMUNITY): Payer: Medicare HMO | Admitting: Anesthesiology

## 2017-03-13 ENCOUNTER — Encounter (HOSPITAL_COMMUNITY)
Admission: EM | Disposition: A | Discharge status: Home Health | Payer: Self-pay | Source: Home / Self Care | Attending: Internal Medicine

## 2017-03-13 ENCOUNTER — Ambulatory Visit: Payer: Self-pay | Admitting: Oncology

## 2017-03-13 ENCOUNTER — Encounter (HOSPITAL_COMMUNITY): Payer: Self-pay | Admitting: Orthopedic Surgery

## 2017-03-13 DIAGNOSIS — R627 Adult failure to thrive: Secondary | ICD-10-CM

## 2017-03-13 DIAGNOSIS — K56699 Other intestinal obstruction unspecified as to partial versus complete obstruction: Secondary | ICD-10-CM

## 2017-03-13 DIAGNOSIS — N179 Acute kidney failure, unspecified: Principal | ICD-10-CM

## 2017-03-13 DIAGNOSIS — C7A020 Malignant carcinoid tumor of the appendix: Secondary | ICD-10-CM

## 2017-03-13 DIAGNOSIS — R110 Nausea: Secondary | ICD-10-CM

## 2017-03-13 DIAGNOSIS — R634 Abnormal weight loss: Secondary | ICD-10-CM

## 2017-03-13 HISTORY — PX: CYSTOSCOPY W/ URETERAL STENT PLACEMENT: SHX1429

## 2017-03-13 LAB — SURGICAL PCR SCREEN
MRSA, PCR: NEGATIVE
Staphylococcus aureus: NEGATIVE

## 2017-03-13 LAB — BASIC METABOLIC PANEL
ANION GAP: 16 — AB (ref 5–15)
BUN: 105 mg/dL — ABNORMAL HIGH (ref 6–20)
CALCIUM: 8.5 mg/dL — AB (ref 8.9–10.3)
CO2: 32 mmol/L (ref 22–32)
Chloride: 89 mmol/L — ABNORMAL LOW (ref 101–111)
Creatinine, Ser: 7.33 mg/dL — ABNORMAL HIGH (ref 0.61–1.24)
GFR, EST AFRICAN AMERICAN: 8 mL/min — AB (ref >60)
GFR, EST NON AFRICAN AMERICAN: 7 mL/min — AB (ref >60)
GLUCOSE: 77 mg/dL (ref 65–99)
POTASSIUM: 5.2 mmol/L — AB (ref 3.5–5.1)
Sodium: 137 mmol/L (ref 135–145)

## 2017-03-13 LAB — CBC WITH DIFFERENTIAL/PLATELET
BASOS ABS: 0 10*3/uL (ref 0.0–0.1)
Basophils Relative: 0 %
EOS ABS: 0 10*3/uL (ref 0.0–0.7)
Eosinophils Relative: 0 %
HCT: 35.6 % — ABNORMAL LOW (ref 39.0–52.0)
Hemoglobin: 11.4 g/dL — ABNORMAL LOW (ref 13.0–17.0)
LYMPHS PCT: 6 %
Lymphs Abs: 0.5 10*3/uL — ABNORMAL LOW (ref 0.7–4.0)
MCH: 27.6 pg (ref 26.0–34.0)
MCHC: 32 g/dL (ref 30.0–36.0)
MCV: 86.2 fL (ref 78.0–100.0)
MONOS PCT: 2 %
Monocytes Absolute: 0.2 10*3/uL (ref 0.1–1.0)
NEUTROS ABS: 7.2 10*3/uL (ref 1.7–7.7)
NEUTROS PCT: 92 %
Platelets: 196 10*3/uL (ref 150–400)
RBC: 4.13 MIL/uL — AB (ref 4.22–5.81)
RDW: 14.8 % (ref 11.5–15.5)
WBC Morphology: INCREASED
WBC: 7.9 10*3/uL (ref 4.0–10.5)

## 2017-03-13 SURGERY — CYSTOSCOPY, WITH RETROGRADE PYELOGRAM AND URETERAL STENT INSERTION
Anesthesia: General | Site: Ureter | Laterality: Bilateral

## 2017-03-13 MED ORDER — FENTANYL CITRATE (PF) 250 MCG/5ML IJ SOLN
INTRAMUSCULAR | Status: AC
  Filled 2017-03-13: qty 5

## 2017-03-13 MED ORDER — ALBUMIN HUMAN 5 % IV SOLN
INTRAVENOUS | Status: AC
  Administered 2017-03-13: 12.5 g via INTRAVENOUS
  Filled 2017-03-13: qty 250

## 2017-03-13 MED ORDER — IOPAMIDOL (ISOVUE-300) INJECTION 61%
INTRAVENOUS | Status: DC | PRN
  Administered 2017-03-13: 5 mL via URETHRAL

## 2017-03-13 MED ORDER — CALCIUM CHLORIDE 10 % IV SOLN
INTRAVENOUS | Status: AC
  Filled 2017-03-13: qty 10

## 2017-03-13 MED ORDER — ONDANSETRON HCL 4 MG/2ML IJ SOLN
INTRAMUSCULAR | Status: DC | PRN
  Administered 2017-03-13: 4 mg via INTRAVENOUS

## 2017-03-13 MED ORDER — MORPHINE SULFATE (PF) 2 MG/ML IV SOLN
1.0000 mg | INTRAVENOUS | Status: DC | PRN
  Administered 2017-03-14 – 2017-03-15 (×2): 1 mg via INTRAVENOUS
  Filled 2017-03-13 (×2): qty 1

## 2017-03-13 MED ORDER — LIDOCAINE 2% (20 MG/ML) 5 ML SYRINGE
INTRAMUSCULAR | Status: AC
  Filled 2017-03-13: qty 5

## 2017-03-13 MED ORDER — LIDOCAINE HCL 2 % EX GEL
CUTANEOUS | Status: DC | PRN
  Administered 2017-03-13: 1

## 2017-03-13 MED ORDER — DEXMEDETOMIDINE HCL 200 MCG/2ML IV SOLN
INTRAVENOUS | Status: DC | PRN
  Administered 2017-03-13 (×8): 8 ug via INTRAVENOUS

## 2017-03-13 MED ORDER — LIDOCAINE HCL 2 % EX GEL
CUTANEOUS | Status: AC
  Filled 2017-03-13: qty 20

## 2017-03-13 MED ORDER — SODIUM CHLORIDE 0.9 % IV SOLN
INTRAVENOUS | Status: DC
  Administered 2017-03-13 – 2017-03-19 (×3): via INTRAVENOUS
  Administered 2017-03-20: 150 mL/h via INTRAVENOUS
  Administered 2017-03-20: 06:00:00 via INTRAVENOUS

## 2017-03-13 MED ORDER — LORAZEPAM 2 MG/ML IJ SOLN
1.0000 mg | INTRAMUSCULAR | Status: DC | PRN
  Administered 2017-03-16 – 2017-03-20 (×2): 1 mg via INTRAVENOUS
  Filled 2017-03-13 (×3): qty 1

## 2017-03-13 MED ORDER — IOPAMIDOL (ISOVUE-300) INJECTION 61%
INTRAVENOUS | Status: AC
  Filled 2017-03-13: qty 50

## 2017-03-13 MED ORDER — STERILE WATER FOR IRRIGATION IR SOLN
Status: DC | PRN
  Administered 2017-03-13: 600 mL

## 2017-03-13 MED ORDER — ALBUMIN HUMAN 5 % IV SOLN
INTRAVENOUS | Status: DC | PRN
  Administered 2017-03-13: 12:00:00 via INTRAVENOUS

## 2017-03-13 MED ORDER — ALBUMIN HUMAN 5 % IV SOLN
INTRAVENOUS | Status: AC
  Filled 2017-03-13: qty 250

## 2017-03-13 MED ORDER — ALBUMIN HUMAN 5 % IV SOLN
12.5000 g | Freq: Once | INTRAVENOUS | Status: AC
  Administered 2017-03-13: 12.5 g via INTRAVENOUS

## 2017-03-13 MED ORDER — SODIUM CHLORIDE 0.9 % IV SOLN: 100.0000 ug/min | INTRAVENOUS | Status: DC

## 2017-03-13 MED ORDER — MIDAZOLAM HCL 2 MG/2ML IJ SOLN
INTRAMUSCULAR | Status: AC
  Filled 2017-03-13: qty 2

## 2017-03-13 MED ORDER — ROCURONIUM BROMIDE 10 MG/ML (PF) SYRINGE
PREFILLED_SYRINGE | INTRAVENOUS | Status: AC
  Filled 2017-03-13: qty 10

## 2017-03-13 MED ORDER — NOREPINEPHRINE BITARTRATE 1 MG/ML IV SOLN
0.0000 ug/min | INTRAVENOUS | Status: DC
  Filled 2017-03-13: qty 4

## 2017-03-13 SURGICAL SUPPLY — 32 items
ADAPTER CATH URET PLST 4-6FR (CATHETERS) IMPLANT
BAG URINE DRAINAGE (UROLOGICAL SUPPLIES) IMPLANT
BAG URO CATCHER STRL LF (MISCELLANEOUS) ×3 IMPLANT
BENZOIN TINCTURE PRP APPL 2/3 (GAUZE/BANDAGES/DRESSINGS) IMPLANT
BLADE 10 SAFETY STRL DISP (BLADE) IMPLANT
BUCKET BIOHAZARD WASTE 5 GAL (MISCELLANEOUS) ×3 IMPLANT
CATH FOLEY 2WAY SLVR  5CC 16FR (CATHETERS)
CATH FOLEY 2WAY SLVR 5CC 16FR (CATHETERS) IMPLANT
CATH URET 5FR 28IN CONE TIP (BALLOONS)
CATH URET 5FR 28IN OPEN ENDED (CATHETERS) ×3 IMPLANT
CATH URET 5FR 70CM CONE TIP (BALLOONS) IMPLANT
DRAPE CAMERA CLOSED 9X96 (DRAPES) IMPLANT
GLOVE BIO SURGEON STRL SZ8 (GLOVE) ×3 IMPLANT
GOWN STRL REUS W/ TWL LRG LVL3 (GOWN DISPOSABLE) ×3 IMPLANT
GOWN STRL REUS W/ TWL XL LVL3 (GOWN DISPOSABLE) ×1 IMPLANT
GOWN STRL REUS W/TWL LRG LVL3 (GOWN DISPOSABLE) ×6
GOWN STRL REUS W/TWL XL LVL3 (GOWN DISPOSABLE) ×2
GUIDEWIRE ANG ZIPWIRE 038X150 (WIRE) ×3 IMPLANT
GUIDEWIRE COOK  .035 (WIRE) IMPLANT
GUIDEWIRE STR DUAL SENSOR (WIRE) ×3 IMPLANT
KIT ROOM TURNOVER OR (KITS) ×3 IMPLANT
NS IRRIG 1000ML POUR BTL (IV SOLUTION) ×3 IMPLANT
PACK CYSTO (CUSTOM PROCEDURE TRAY) ×3 IMPLANT
PAD ARMBOARD 7.5X6 YLW CONV (MISCELLANEOUS) ×6 IMPLANT
PLUG CATH AND CAP STER (CATHETERS) IMPLANT
STENT CONTOUR NO GW 8FR 26CM (STENTS) IMPLANT
STENT URET 6FRX24 CONTOUR (STENTS) IMPLANT
SYRINGE CONTROL L 12CC (SYRINGE) ×3 IMPLANT
SYRINGE TOOMEY DISP (SYRINGE) IMPLANT
UNDERPAD 30X30 (UNDERPADS AND DIAPERS) ×3 IMPLANT
WATER STERILE IRR 1000ML POUR (IV SOLUTION) ×3 IMPLANT
WIRE COONS/BENSON .038X145CM (WIRE) IMPLANT

## 2017-03-13 NOTE — Progress Notes (Signed)
Responded to [facility] Consult to provide support to patient's  wife and daughter. Pt. currently In procedure. Per pt.'s nurse wife is very tearful and having difficulty coping and excepting diagnosis . Per nurse pt. is still full code.  Wife has been informed by doctor that there is nothing else that can be done to change outcome.  Family is in denial and has unreality expectations. When pt. Returns from surgery nurse will page chaplain to continue support.  Chaplain available as needed. Jaclynn Major, [place], [facility], Pager [phone]

## 2017-03-13 NOTE — Op Note (Signed)
Preoperative diagnosis: Bilateral malignant hydronephrosis with acute renal failure.  Postoperative diagnosis: Same.  Principal procedure: Cystoscopy, right retrograde pyelogram, attempted left retrograde pyelogram, attempted bilateral ureteral stent placement.  Surgeon: Diona Fanti  Anesthesia: Monitored anesthesia care.  Complications: Inability to cannulate either ureter due to tumor burden.  Estimated blood loss: None.  Drains: None.  Indications: 71 year old male with metastatic carcinoid.  I was consult last night for management of bilateral hydronephrosis.  The patient and his family still desire aggressive management of his cancer, which at this point is quite progressive.  He was found to have bilateral hydronephrosis, with worsened left renal appearance compared to before, and new onset right hydronephrosis.  Both nephrology and the hospital service consult admitted for further management.  I had an extensive discussion with the patient and his wife last night regarding treatment options.  These have been discussed with them in the past, during treatment in [place].  They are knowledgeable about stent placement as well as percutaneous nephrostomy tubes.  I also counseled them last night that no treatment of this hydronephrosis is a reasonable option, especially with his progressive disease.  They have decided to try stent placement.  Findings: Moderate trabeculation throughout the bladder.  Trigone was distorted with tumor encroachment.  The right ureteral orifice was identified, I could not identify the left orifice.  There were no urothelial lesions.  Retrograde ureteropyelogram on the right revealed a dilated distal 2-3 centimeters of the ureter, with some fairly significant tortuosity and multiple narrowed areas of the ureter consistent with retroperitoneal carcinomatosis.  Description of procedure: The patient was properly identified in the holding area.  He received  preoperative IV antibiotics.  He was taken to the operating room.  As the patient was hypotensive and tachycardic, anesthesia placed a central line.  He was fluid resuscitated.  He was then given gentle sedation.  Proper timeout was performed.  Following the patient being placed in the dorsolithotomy position.  Genitalia and perineum were prepped and draped.  A 21 French panendoscope was advanced through his urethra, which is normal.  High riding bladder neck noted without prosthetic obstruction.  Bladder entered and inspected circumferentially.  Distortion of the trigone noted.  The left ureteral orifice was not identified.  I tried probing with a 5 [place] open-ended catheter without significant success in finding the orifice.  I did identify the right orifice.  This was cannulated with the 6 [place] open-ended catheter and retrograde ureteropyelogram was performed, with the above-mentioned findings noted.  First, I tried a sensor tip guidewire (0.038 inch) to try to negotiate this proximally within the ureter.  This was unsuccessful.  I then used a Teflon-coated zip wire.  20 minutes of probing using fluoroscopic guidance at times was unsuccessful in being able to pass the guidewire more proximally in the ureter.  There was an obvious stricture 2-3 centimeters up, associated with tortuosity.  Eventually, I realized that despite aggressive attempts at cannulization of the ureter, this would be unsuccessful.  I did relate this to the patient, who was just mildly sedated.  At this point, the bladder was drained and the scope removed.  The patient tolerated the procedure well.  He was then returned to the PACU in stable condition.

## 2017-03-13 NOTE — Transfer of Care (Signed)
Immediate Anesthesia Transfer of Care Note  Patient: Christopher Potts  Procedure(s) Performed: Procedure(s): CYSTOSCOPY WITH RIGHT RETROGRADE PYELOGRAM,  ATTEMPTED URETERAL STENT PLACEMENT (Bilateral)  Patient Location: PACU  Anesthesia Type:General  Level of Consciousness: awake, alert  and oriented  Airway & Oxygen Therapy: Patient Spontanous Breathing and Patient connected to nasal cannula oxygen  Post-op Assessment: Report given to RN, Post -op Vital signs reviewed and stable and Patient moving all extremities X 4  Post vital signs: Reviewed and stable  Last Vitals:  Vitals:   03/12/17 2016 03/13/17 0434  BP: 113/64 96/65  Pulse: 71 (!) 125  Resp: 12 14  Temp: (!) 36.3 C 37.7 C  SpO2: 91% 92%    Last Pain:  Vitals:   03/12/17 0806  TempSrc: Oral         Complications: No apparent anesthesia complications

## 2017-03-13 NOTE — Anesthesia Postprocedure Evaluation (Signed)
Anesthesia Post Note  Patient: Christopher Potts  Procedure(s) Performed: Procedure(s) (LRB): CYSTOSCOPY WITH RIGHT RETROGRADE PYELOGRAM,  ATTEMPTED URETERAL STENT PLACEMENT (Bilateral)     Patient location during evaluation: PACU Anesthesia Type: MAC Level of consciousness: awake and oriented Pain management: pain level controlled Vital Signs Assessment: vitals unstable Cardiovascular status: unstable Anesthetic complications: no Comments: Patient with persistent hypotension in PACU. Patient converted to DNR status.    Last Vitals:  Vitals:   03/13/17 1400 03/13/17 1415  BP: (!) 80/51 (!) 77/48  Pulse: 88 86  Resp: (!) 21 (!) 21  Temp:    SpO2: 95% 94%    Last Pain:  Vitals:   03/12/17 0806  TempSrc: Oral                 [name]
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
## 2017-03-13 NOTE — Progress Notes (Signed)
Pt more awake this am. I have discussed cysto/stent placement w/ both he and his wife. Will proceed as planned.

## 2017-03-13 NOTE — Progress Notes (Signed)
Pt c/o "heartburn and nausea." Pt's NGT clamped from floor during transport to short stay--hooked pt up to suction.  Immediately drained off 1L green bile looking fluid within 5 minutes.  Pt reports relief from nausea.  Wife reports "I don't think suction was working last night."  Dr. Diona Fanti made aware of amount of gastric fluid suctioned in short stay area.

## 2017-03-13 NOTE — Progress Notes (Signed)
[facility] Brief Note  Events of today noted with inability to place ureteral stent d/t large tumor burden. I note that pt has expressed wishes to die at home, has been made DNR, taken of neo (started earlier in the day 2/2 hypotension) and that there are to be further discussions tomorrow regarding Hospice.  As such, Nephrology would have nothing else to contribute to this gentleman's care and will sign off at this time.  Jamal Maes, MD [facility] [phone] Pager 03/13/2017, 6:23 PM

## 2017-03-13 NOTE — Anesthesia Preprocedure Evaluation (Signed)
Anesthesia Evaluation  Patient identified by MRN, date of birth, ID band Patient awake    Reviewed: Allergy & Precautions, NPO status , Patient's Chart, lab work & pertinent test results  Airway Mallampati: II  TM Distance: >3 FB Neck ROM: Full    Dental  (+) Teeth Intact, Dental Advisory Given   Pulmonary    breath sounds clear to auscultation       Cardiovascular  Rhythm:Regular Rate:Normal     Neuro/Psych    GI/Hepatic   Endo/Other    Renal/GU      Musculoskeletal   Abdominal   Peds  Hematology   Anesthesia Other Findings   Reproductive/Obstetrics                             Anesthesia Physical Anesthesia Plan  ASA: III  Anesthesia Plan: General   Post-op Pain Management:    Induction: Intravenous  PONV Risk Score and Plan: Dexamethasone and Ondansetron  Airway Management Planned: LMA  Additional Equipment:   Intra-op Plan:   Post-operative Plan:   Informed Consent: I have reviewed the patients History and Physical, chart, labs and discussed the procedure including the risks, benefits and alternatives for the proposed anesthesia with the patient or authorized representative who has indicated his/her understanding and acceptance.   Dental advisory given  Plan Discussed with: CRNA and Anesthesiologist  Anesthesia Plan Comments:         Anesthesia Quick Evaluation

## 2017-03-13 NOTE — Progress Notes (Signed)
PROGRESS NOTE    KRISTI HYER  DTO:[id number] DOB: 08/30/1945 DOA: 03/10/2017 PCP: [name], MD   Brief Narrative:71 y.o.malewith medical history significant of carcinoid tumor of the appendix, malignant who was admitted last week at [facility] for "SBO, treated conservatively, still having N/V" as of 9/10, with recommendation to f/u with oncology for progression of disease seen on CT. His family reports that he is here with dehydration. "We think it's because of the immune therapy I've had 3 times. I was dehydrated down to nothing. 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 Started immunotherapy about 6 weeks ago, 1 treatmentevery other week (due Tuesday but he declined). He thinks the loose bowels and n/v with resultant dehydrationstarted right after the immunotherapy. "It tends to dry out everything". Each treatment it got worse. He was hospitaized last week in [place]; they stuck a tube down my throat and pumped out acid and put fluid in my veins. Eventually they took the tube out and were giving liquids." Admitted for Intractable N/V with severe AKI from Dehydration and Hypovolemia. Abdomen and pelvis done yesterday shows distended and fluid-filled distally saphenous, stomach and proximal small bowel indicates he wore for obstruction. Tiny right pleural effusion. Bilateral kidney stones bilateral hydronephrosis. Possible bladder wall thickening. Renal ultrasound showed bilateral hydronephrosis left more than right. CT scan of the abdomen did show bilateral hydronephrosis with increasing tumor burden. I appreciate it urology Dr. Diona Fanti for trying to put a stent in. I have seen the patient in PACU. Patient was started on Neo-Synephrine due to low blood pressure. Patient always had a low blood pressure systolic around 9833. After  prolonged day of discussion with the family wife, daughter I talked them in PACU . Patient was awake and alert though his blood pressure was low. He expresses express a desire to die at home. He was concerned about the bills. He was afraid to get hospice on board due to increasing bills. But he was agreeable to DO NOT RESUSCITATE DO NOT INTUBATE. I did speak with the PA with palliative care Stanton Kidney and Stanton Kidney did discuss with patient's wife multiple times. Finally the family and the patient has come to a conclusion to be DO NOT RESUSCITATE and will DC the Neo-Synephrine and send the patient back to the floor and will discuss about hospice tomorrow. The wife is waiting for her daughter to come in order to make the final decision about hospice. 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   proximal small bowel obstruction possibly secondary to tumor burden as well.   Overall plan is to keep the patient DO NOT RESUSCITATE .I will continue IV hydration. And will reassess the situation tomorrow. I appreciate all the consultants involved in taking care of this patient with complex medical surgical issues.  DVT prophylaxis:  Code Status: dnr Family Communication:  Disposition Plan: tbd   Consultants: uro,nephro.  Procedures:   Antimicrobials:  none   Subjective:asking for food.  Objective: Vitals:  03/13/17 1330 03/13/17 1345 03/13/17 1400 03/13/17 1415  BP: (!) 58/45 (!) 74/56 (!) 80/51 (!) 77/48  Pulse: (!) 119 84 88 86  Resp: 19 20 (!) 21 (!) 21  Temp:      TempSrc:      SpO2: 96% 95% 95% 94%  Weight:      Height:         Intake/Output Summary (Last 24 hours) at 03/13/17 1702 Last data filed at 03/13/17 1156  Gross per 24 hour  Intake          3720.83 ml  Output             1440 ml  Net          2280.83 ml   Filed Weights   03/11/17 1954 03/12/17 2016 03/13/17 0834  Weight: 57.6 kg (127 lb) 60.8 kg (134 lb) 60.8 kg (134 lb)    Examination:  General exam: Appears calm and comfortable  Respiratory system: Clear to auscultation. Respiratory effort normal. Cardiovascular system: S1 & S2 heard, RRR. No JVD, murmurs, rubs, gallops or clicks. No pedal edema. Gastrointestinal system: Abdomen is nondistended, soft and nontender. No organomegaly or masses felt. Normal bowel sounds heard. Central nervous system: Alert and oriented. No focal neurological deficits. Extremities: Symmetric 5 x 5 power. Skin: No rashes, lesions or ulcers Psychiatry: Judgement and insight appear normal. Mood & affect appropriate.     Data Reviewed: I have personally reviewed following labs and imaging studies  CBC:  Recent Labs Lab 03/10/17 1603 03/11/17 0713 03/12/17 0517 03/13/17 0208  WBC 6.8 7.5 5.5 7.9  NEUTROABS  --   --  5.0 7.2  HGB 12.8* 10.7* 12.1* 11.4*  HCT 38.6* 31.6* 36.6* 35.6*  MCV 84.3 83.8 85.1 86.2  PLT 239 169 166 676   Basic Metabolic Panel:  Recent Labs Lab 03/10/17 1603 03/11/17 0713 03/11/17 0830 03/11/17 1830 03/12/17 0517 03/13/17 0208  NA 129* 131*  --  133* 135 137  K 3.6 3.0*  --  3.6 5.3* 5.2*  CL 72* 76*  --  79* 86* 89*  CO2 41* 38*  --  39* 33* 32  GLUCOSE 128* 101*  --  122* 116* 77  BUN 86* 92*  --  90* 97* 105*  CREATININE 4.12* 5.36*  --  6.23* 6.33* 7.33*  CALCIUM 9.2 8.7*  --  8.6* 8.5* 8.5*  MG  --  2.4  --   --  2.1  --   PHOS  --   --  5.2*  --  4.7*  --    GFR: Estimated Creatinine Clearance: 7.9 mL/min (A) (by C-G formula based on SCr of 7.33 mg/dL (H)). Liver Function Tests:  Recent Labs Lab 03/12/17 0517  AST 40  ALT 20  ALKPHOS 51  BILITOT  1.6*  PROT 5.4*  ALBUMIN 2.8*   No results for input(s): LIPASE, AMYLASE in the last 168 hours. No results for input(s): AMMONIA in the last 168 hours. Coagulation Profile: No results for input(s): INR, PROTIME in the last 168 hours. Cardiac Enzymes:  Recent Labs Lab 03/12/17 0007  TROPONINI 0.08*   BNP (last 3 results) No results for input(s): PROBNP in the last 8760 hours. HbA1C: No results for input(s): HGBA1C in the last 72 hours. CBG: No results for input(s): GLUCAP in the last 168 hours. Lipid Profile: No results for input(s): CHOL, HDL, LDLCALC, TRIG, CHOLHDL, LDLDIRECT in the last 72 hours. Thyroid Function Tests: No results for input(s): TSH, T4TOTAL,  FREET4, T3FREE, THYROIDAB in the last 72 hours. Anemia Panel: No results for input(s): VITAMINB12, FOLATE, FERRITIN, TIBC, IRON, RETICCTPCT in the last 72 hours. Sepsis Labs: No results for input(s): PROCALCITON, LATICACIDVEN in the last 168 hours.  Recent Results (from the past 240 hour(s))  C difficile quick scan w PCR reflex     Status: None   Collection Time: 03/10/17 11:55 PM  Result Value Ref Range Status   C Diff antigen NEGATIVE NEGATIVE Final   C Diff toxin NEGATIVE NEGATIVE Final   C Diff interpretation No C. difficile detected.  Final  Surgical pcr screen     Status: None   Collection Time: 03/12/17  9:56 PM  Result Value Ref Range Status   MRSA, PCR NEGATIVE NEGATIVE Final   Staphylococcus aureus NEGATIVE NEGATIVE Final    Comment: (NOTE) The Xpert SA Assay (FDA approved for NASAL specimens in patients 36 years of age and older), is one component of a comprehensive surveillance program. It is not intended to diagnose infection nor to guide or monitor treatment.          Radiology Studies: Ct Abdomen Pelvis Wo Contrast  Result Date: 03/12/2017 CLINICAL DATA:  Shortness of breath, nausea and vomiting. Appendiceal carcinoid. Loose bowels. EXAM: CT ABDOMEN AND PELVIS WITHOUT CONTRAST TECHNIQUE:  Multidetector CT imaging of the abdomen and pelvis was performed following the standard protocol without IV contrast. COMPARISON:  01/06/2017. FINDINGS: Lower chest: Peribronchovascular ground-glass and consolidation in the lingula and both lower lobes. Tiny right pleural effusion. Heart size normal. No pericardial effusion. Distal esophagus is dilated and fluid-filled. Hepatobiliary: Liver and gallbladder are grossly unremarkable. A 1.7 x 3.4 cm low-attenuation collection along the posterior border of the right hepatic lobe is likely a subcapsular fluid collection. There is significant streak artifact from the patient's arms. No definite biliary ductal dilatation. Pancreas: Grossly unremarkable. Spleen: Negative. Adrenals/Urinary Tract: There may be slight thickening of the right adrenal gland. Left adrenal gland is grossly unremarkable. Small stones in the kidneys. Moderate right hydronephrosis without cause identified. Persistent severe left hydronephrosis. Left ureter is difficult to follow without IV contrast. Bladder is low in volume with possible wall thickening. Perivesical stranding and fluid, progressive. Stomach/Bowel: Stomach and proximal small bowel are distended with fluid. Horizontal portion of the duodenum measures up to 4.4 cm. There may be a jejunal transition point in the left lower quadrant. Left lower quadrant colostomy. Right and left hemicolectomies with a left lower quadrant colostomy. Rectosigmoid colon appears tethered to the sacrum and may be postoperative in etiology. Vascular/Lymphatic: Mild atherosclerotic calcification of the aorta. No definite pathologically enlarged lymph nodes. Reproductive: Prostate is visualized. Other: Presacral edema. Small left inguinal hernia contains fat. No free fluid. Musculoskeletal: No worrisome lytic or sclerotic lesions. Degenerative changes in the spine. IMPRESSION: 1. Distended and fluid-filled distal esophagus, stomach and proximal small bowel,  indicative of obstruction, with a possible jejunal transition point in the left lower quadrant. 2. Lingular and bilateral lower lobe ground-glass and consolidation may be due to aspiration or pneumonia. 3. Tiny right pleural effusion. 4. Low-attenuation along the posterior right hepatic lobe is suspicious for a small subcapsular fluid collection. 5. Bilateral renal stones. Bilateral hydronephrosis, etiology indeterminate. 6. Low volume bladder with possible wall thickening. Perivesical edema and stranding. Difficult to exclude cystitis. 7.  Aortic atherosclerosis (ICD10-170.0). Electronically Signed   By: Lorin Picket M.D.   On: 03/12/2017 08:58   US Abdomen Complete  Result Date: 03/11/2017 CLINICAL DATA:  Nausea and vomiting. EXAM:  ABDOMEN ULTRASOUND COMPLETE COMPARISON:  None. FINDINGS: Gallbladder: 15 mm gallstone. No gallbladder wall thickening, pericholecystic fluid or other secondary signs of acute cholecystitis. No sonographic Murphy's sign. Common bile duct: Diameter: Normal at 3 mm. Liver: No focal lesion identified. Within normal limits in parenchymal echogenicity. Portal vein is patent on color Doppler imaging with normal direction of blood flow towards the liver. IVC: No abnormality visualized. Pancreas: Visualized portion unremarkable. Spleen: Obscured by the overlying stomach which appears to be distended with recently ingested material. Right Kidney: Length: 11.3 cm. Mild hydronephrosis. Cortical thickness and echogenicity is within normal limits without mass. No renal stones seen. Left Kidney: Length: 12.8 cm. Moderate hydronephrosis. Cortical thickness and echogenicity is within normal limits. No mass. Lower pole left renal stone measures 1.7 cm greatest dimension. Abdominal aorta: No aneurysm visualized. Other findings: Bladder is decompressed. IMPRESSION: 1. Moderate left-sided hydronephrosis. Mild right-sided hydronephrosis. Consider CT abdomen and pelvis for further characterization.  Bladder is decompressed. Left renal stone measures 1.7 cm. 2. Cholelithiasis without evidence of acute cholecystitis. No bile duct dilatation. 3. Spleen is obscured by the overlying stomach which appears to be distended with recently ingested fluid/material. Electronically Signed   By: Franki Cabot M.D.   On: 03/11/2017 23:27   Dg C-arm 1-60 Min-no Report  Result Date: 03/13/2017 Fluoroscopy was utilized by the requesting physician.  No radiographic interpretation.        Scheduled Meds: . calcium chloride      . enoxaparin (LOVENOX) injection  30 mg Subcutaneous Q24H  . multivitamin with minerals  1 tablet Oral Daily   Continuous Infusions: . sodium chloride 125 mL/hr at 03/13/17 0115  . sodium chloride 10 mL/hr at 03/13/17 0922  . albumin human       LOS: 3 days     Georgette Shell, MD [facility]   If 7PM-7AM, please contact night-coverage [email or web address] Password [email or web address] 03/13/2017, 5:02 PM

## 2017-03-14 ENCOUNTER — Encounter (HOSPITAL_COMMUNITY): Payer: Self-pay | Admitting: Urology

## 2017-03-14 MED ORDER — PANTOPRAZOLE SODIUM 40 MG IV SOLR
40.0000 mg | INTRAVENOUS | Status: DC
  Administered 2017-03-14 – 2017-03-21 (×8): 40 mg via INTRAVENOUS
  Filled 2017-03-14 (×8): qty 40

## 2017-03-14 MED ORDER — PHENOL 1.4 % MT LIQD
1.0000 | OROMUCOSAL | Status: DC | PRN
Start: 2017-03-14 — End: 2017-03-21

## 2017-03-14 NOTE — Clinical Social Work Note (Signed)
CSW visited room earlier today and talked with patient and wife regarding discharge disposition and oncology's recommendation of hospice. Patient and wife both agreeable to hospice service and requested home hospice in [place]. CSW talked with patient and family regarding some of the services that will be provided and informed them that the nurse case manager will be advised of their choice.  Nurse case manager advised and assisted family.  Jailyne Chieffo Givens, MSW, LCSW Licensed Clinical Social Worker [place] [phone]

## 2017-03-14 NOTE — Progress Notes (Signed)
IP PROGRESS NOTE  Subjective:   Christopher Potts underwent attempted placement of ureter stents yesterday. I discussed the case with Dr. Diona Fanti. There was tumor involving the bladder. The left ureter orifice could not be identified. The right orifice was accessed and there were multiple areas of narrowing. A stent could not be placed.  He complains of "heartburn "and hiccups. He has hematuria this morning.  He would like to try eating.  Objective: Vital signs in last 24 hours: Blood pressure (!) 99/56, pulse 98, temperature 98.3 F (36.8 C), temperature source Oral, resp. rate 16, height 6\' 1"  (1.854 m), weight 143 lb 1.6 oz (64.9 kg), SpO2 95 %.  Intake/Output from previous day: 09/20 0701 - 09/21 0700 In: 3752.3 [I.V.:3502.3; IV Piggyback:250] Out: 2315 [Emesis/NG output:2110; Stool:200; Blood:5]  Physical Exam:  HEENT: No thrush Lungs: Clear anteriorly Cardiac: Regular rate and rhythm Abdomen: Firm fullness in the mid lower abdomen, left abdominal ostomy bag with soft brown stool   Portacath/PICC-without erythema  Lab Results:  Recent Labs  03/12/17 0517 03/13/17 0208  WBC 5.5 7.9  HGB 12.1* 11.4*  HCT 36.6* 35.6*  PLT 166 196    BMET  Recent Labs  03/12/17 0517 03/13/17 0208  NA 135 137  K 5.3* 5.2*  CL 86* 89*  CO2 33* 32  GLUCOSE 116* 77  BUN 97* 105*  CREATININE 6.33* 7.33*  CALCIUM 8.5* 8.5*     Medications: I have reviewed the patient's current medications.  Assessment/Plan:  1. 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 Acute renal failure secondary to obstructive uropathy  Cystoscopy 03/13/2017 confirmed tumor involving the bladder, unable to place ureter stents 3. Small bowel obstruction, NG tube in place 4. 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 He has renal failure secondary to obstructive uropathy and a small bowel obstruction, likely secondary to progressive carcinomatosis.  He is not a candidate for further systemic therapy. I recommend Hospice care. Christopher Potts and his wife are in agreement. He asked me to discuss the situation with his daughter. I will call her today.  His lifespan will likely be limited in the setting of renal failure. He may be a candidate for a palliative venting gastrostomy tube if it appears he will live beyond days. Alternatively he could go home with hospice and an NG tube in place. He appears to be a candidate for residential hospice. We discussed a trial of clamping the NG tube, he will most likely not tolerate this with the current large drainage from the NG.  Recommendations:  1. Care management consult to arrange for Hospice care in [place] 2. Chloraseptic spray, antiacid therapy 3. Consider placement of a venting gastrostomy tube for palliation of the bowel obstruction  Please call Oncology as needed. I will be glad to help with his care at discharge.  Addendum:  I contacted his daughter by telephone. I discussed the poor prognosis and recommendation for comfort care. She indicated Christopher Potts would like to return home as opposed to residential hospice. We discussed the possibility of a venting gastrostomy tube. She would like help from the hospital social worker to arrange for a power of attorney document.   LOS: 4 days   Donneta Romberg, MD   03/14/2017, 8:19 AM

## 2017-03-14 NOTE — Care Management Note (Addendum)
Case Management Note  Patient Details  Name: Christopher Potts MRN: [id number] Date of Birth: [birth year]  Subjective/Objective:      CM following for progression and d/c planning.              Action/Plan: 03/14/2017 Notified that this pt and family have selected home hospice services with [facility]. This CM spoke with pt and family. Pt wishes for contact person to be his daughter Christopher Potts @ [phone]. Pt states that he has a hospital bed and a walker at home. This CM contacted [facility] and faxed info to [employer] at that agency. 2:50 pm Spoke with Lovenia Shuck who states that the faxed materials have arrived and that she will sent to the admitting nurse for review. Await confirmation of home hospice availability. Pt and family informed.  4:05 pm Received a call from [facility], who states that they will be able to provide [facility] services for this pt. [name] informed and plans to d/c this pt tomorrow, 03/15/2017.  Expected Discharge Date:                  Expected Discharge Plan:  Home w Hospice Care  In-House Referral:  Clinical Social Work  Discharge planning Services  CM Consult  Post Acute Care Choice:  Hospice Choice offered to:  Spouse, Patient, Adult Children  DME Arranged:  N/A DME Agency:  NA  HH Arranged:  RN, Nurse's Aide, Social Work [employer] Agency:  Other - See comment ([facility]. )  Status of Service:  In process, will continue to follow  If discussed at Long Length of Stay Meetings, dates discussed:    Additional Comments:  Adron Bene, RN 03/14/2017, 2:49 PM

## 2017-03-14 NOTE — Progress Notes (Addendum)
PROGRESS NOTE    Christopher Potts  IZT:[id number] DOB: 05-18-1946 DOA: 03/10/2017 PCP: [name], MD   Brief Narrative: 71 y.o.malewith medical history significant of carcinoid tumor of the appendix, malignant who was admitted last week at [facility] for "SBO, treated conservatively, still having N/V" as of 9/10, with recommendation to f/u with oncology for progression of disease seen on CT. His family reports that he is here with dehydration. "We think it's because of the immune therapy I've had 3 times. I was dehydrated down to nothing. 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 Started immunotherapy about 6 weeks ago, 1 treatmentevery other week (due Tuesday but he declined). He thinks the loose bowels and n/v with resultant dehydrationstarted right after the immunotherapy. "It tends to dry out everything". Each treatment it got worse. He was hospitaized last week in [place]; they stuck a tube down my throat and pumped out acid and put fluid in my veins. Eventually they took the tube out and were giving liquids." Admitted for Intractable N/V with severe AKI from Dehydration and Hypovolemia. Abdomen and pelvis done yesterday shows distended and fluid-filled distally saphenous, stomach and proximal small bowel indicates he wore for obstruction. Tiny right pleural effusion. Bilateral kidney stones bilateral hydronephrosis. Possible bladder wall thickening. Renal ultrasound showed bilateral hydronephrosis left more than right. CT scan of the abdomen did show bilateral hydronephrosis with increasing tumor burden. I appreciate it urology Dr. Diona Fanti for trying to put a stent in. I have seen the patient in PACU. Patient was started on Neo-Synephrine due to low blood pressure. Patient always had a low blood pressure systolic around 3825.  After prolonged day of discussion with the family wife, daughter I talked them in PACU . Patient was awake and alert though his blood pressure was low. He expresses express a desire to die at home. He was concerned about the bills. He was afraid to get hospice on board due to increasing bills. But he was agreeable to DO NOT RESUSCITATE DO NOT INTUBATE. I did speak with the PA with palliative care Stanton Kidney and Stanton Kidney did discuss with patient's wife multiple times. Finally the family and the patient has come to a conclusion to be DO NOT RESUSCITATE and will DC the Neo-Synephrine and send the patient back to the floor and will discuss about hospice tomorrow. The wife is waiting for her daughter to come in order to make the final decision about hospice. NG tube in place.i appreciate all the consultants input and help with this challenging case.      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   proximal small bowel obstruction possibly secondary to tumor burden. None DVT prophylaxis: None Code Status: DO NOT RESUSCITATE Family Communication Disposition Plan possible home with hospice   Consultants: Nephrology, urology, hematology oncology.  Procedures: NG tube, attempted stent placement in the ureters.  Antimicrobials: None   Subjective: Wants to eat some pudding.  Objective: Vitals:   03/13/17 1728 03/13/17 2215 03/14/17 0413  03/14/17 0826  BP: (!) 75/43 (!) 82/52 (!) 99/56 98/61  Pulse: 90 95 98 (!) 112  Resp: 18 16 16 18   Temp: (!) 97.5 F (36.4 C) 97.8 F (36.6 C) 98.3 F (36.8 C) 97.6 F (36.4 C)  TempSrc: Oral  Axillary Oral Oral  SpO2: 93% 93% 95% 96%  Weight:  64.9 kg (143 lb 1.6 oz)    Height:        Intake/Output Summary (Last 24 hours) at 03/14/17 1150 Last data filed at 03/14/17 1000  Gross per 24 hour  Intake          2752.33 ml  Output             1950 ml  Net           802.33 ml   Filed Weights   03/12/17 2016 03/13/17 0834 03/13/17 2215  Weight: 60.8 kg (134 lb) 60.8 kg (134 lb) 64.9 kg (143 lb 1.6 oz)    Examination:  General exam: Appears calm and comfortable  Respiratory system: Clear to auscultation. Respiratory effort normal. Cardiovascular system: S1 & S2 heard, RRR. No JVD, murmurs, rubs, gallops or clicks. No pedal edema. Gastrointestinal system: Abdomen is nondistended, soft and nontender. No organomegaly or masses felt. Normal bowel sounds heard. Central nervous system: Alert and oriented. No focal neurological deficits. Extremities: Symmetric 5 x 5 power. Skin: No rashes, lesions or ulcers Psychiatry: Judgement and insight appear normal. Mood & affect appropriate.     Data Reviewed: I have personally reviewed following labs and imaging studies  CBC:  Recent Labs Lab 03/10/17 1603 03/11/17 0713 03/12/17 0517 03/13/17 0208  WBC 6.8 7.5 5.5 7.9  NEUTROABS  --   --  5.0 7.2  HGB 12.8* 10.7* 12.1* 11.4*  HCT 38.6* 31.6* 36.6* 35.6*  MCV 84.3 83.8 85.1 86.2  PLT 239 169 166 983   Basic Metabolic Panel:  Recent Labs Lab 03/10/17 1603 03/11/17 0713 03/11/17 0830 03/11/17 1830 03/12/17 0517 03/13/17 0208  NA 129* 131*  --  133* 135 137  K 3.6 3.0*  --  3.6 5.3* 5.2*  CL 72* 76*  --  79* 86* 89*  CO2 41* 38*  --  39* 33* 32  GLUCOSE 128* 101*  --  122* 116* 77  BUN 86* 92*  --  90* 97* 105*  CREATININE 4.12* 5.36*  --  6.23* 6.33* 7.33*  CALCIUM 9.2 8.7*  --  8.6* 8.5* 8.5*  MG  --  2.4  --   --  2.1  --   PHOS  --   --  5.2*  --  4.7*  --    GFR: Estimated Creatinine Clearance: 8.5 mL/min (A) (by C-G formula based on SCr of 7.33 mg/dL  (H)). Liver Function Tests:  Recent Labs Lab 03/12/17 0517  AST 40  ALT 20  ALKPHOS 51  BILITOT 1.6*  PROT 5.4*  ALBUMIN 2.8*   No results for input(s): LIPASE, AMYLASE in the last 168 hours. No results for input(s): AMMONIA in the last 168 hours. Coagulation Profile: No results for input(s): INR, PROTIME in the last 168 hours. Cardiac Enzymes:  Recent Labs Lab 03/12/17 0007  TROPONINI 0.08*   BNP (last 3 results) No results for input(s): PROBNP in the last 8760 hours. HbA1C: No results for input(s): HGBA1C in the last 72 hours. CBG: No results for input(s): GLUCAP in the last 168 hours. Lipid Profile: No results for input(s): CHOL, HDL, LDLCALC, TRIG, CHOLHDL, LDLDIRECT in the  last 72 hours. Thyroid Function Tests: No results for input(s): TSH, T4TOTAL, FREET4, T3FREE, THYROIDAB in the last 72 hours. Anemia Panel: No results for input(s): VITAMINB12, FOLATE, FERRITIN, TIBC, IRON, RETICCTPCT in the last 72 hours. Sepsis Labs: No results for input(s): PROCALCITON, LATICACIDVEN in the last 168 hours.  Recent Results (from the past 240 hour(s))  C difficile quick scan w PCR reflex     Status: None   Collection Time: 03/10/17 11:55 PM  Result Value Ref Range Status   C Diff antigen NEGATIVE NEGATIVE Final   C Diff toxin NEGATIVE NEGATIVE Final   C Diff interpretation No C. difficile detected.  Final  Surgical pcr screen     Status: None   Collection Time: 03/12/17  9:56 PM  Result Value Ref Range Status   MRSA, PCR NEGATIVE NEGATIVE Final   Staphylococcus aureus NEGATIVE NEGATIVE Final    Comment: (NOTE) The Xpert SA Assay (FDA approved for NASAL specimens in patients 52 years of age and older), is one component of a comprehensive surveillance program. It is not intended to diagnose infection nor to guide or monitor treatment.          Radiology Studies: Dg C-arm 1-60 Min-no Report  Result Date: 03/13/2017 Fluoroscopy was utilized by the requesting  physician.  No radiographic interpretation.        Scheduled Meds: . enoxaparin (LOVENOX) injection  30 mg Subcutaneous Q24H  . multivitamin with minerals  1 tablet Oral Daily  . pantoprazole (PROTONIX) IV  40 mg Intravenous Q24H   Continuous Infusions: . sodium chloride 125 mL/hr at 03/14/17 0402  . sodium chloride 10 mL/hr at 03/13/17 2122     LOS: 4 days     Georgette Shell, MD [facility]  If 7PM-7AM, please contact night-coverage [email or web address] Password [email or web address] 03/14/2017, 11:50 AM

## 2017-03-14 NOTE — Progress Notes (Signed)
Late Entry: This nurse and Otila Kluver RN changed NG tube per pt's/families request. Family and pt stated NG tube has not been suctioning for several hours and was needing to be changed. After checking placement and suction the tube was removed and the tube was clogged at the end. A new tube was inserted in the left nare and was suctioning a dark greenish/black liquid. Pt stated he felt much better after tube started draining.   Eleanora Neighbor, RN

## 2017-03-14 NOTE — Care Management Important Message (Signed)
Important Message  Patient Details  Name: Christopher Potts MRN: [id number] Date of Birth: October 03, 1945   Medicare Important Message Given:  Yes    Nathen May 03/14/2017, 10:05 AM

## 2017-03-14 NOTE — Progress Notes (Signed)
Patient was in his bed with a nosal-gastric tube. There was an admission nurse on-site talking to him   patient's wife was on-site waiting for their daughter to arrive. The patient was handed the advance directive paper work which he and wife said they both needed time to go through as they were waiting for their daughter to help determine the final decision. Chaplain Nathaneil Feagans told the patient and wife to call for help as soon as their daughter arrived and they needed to move forward.  Chaplain Ariv Penrod.

## 2017-03-15 DIAGNOSIS — C800 Disseminated malignant neoplasm, unspecified: Secondary | ICD-10-CM

## 2017-03-15 LAB — PROTIME-INR
INR: 1.48
PROTHROMBIN TIME: 17.8 s — AB (ref 11.4–15.2)

## 2017-03-15 NOTE — Progress Notes (Addendum)
Patient ID: Christopher Potts, male   DOB: 05/29/1946, 71 y.o.   MRN: [id number]  IR aware of request for B PCN and venting Gastric tube. Metastatic carcinomatosis--goblet cell cancer--appendix Small bowel obstruction and B hydronephrosis For home with Hospice  Will review imaging and chart with [name] for approval

## 2017-03-15 NOTE — Progress Notes (Signed)
Pt is wanting fluids to be stopped. He states he is swelling and does not want it. Messaged MD.  Eleanora Neighbor, RN

## 2017-03-15 NOTE — Progress Notes (Signed)
PROGRESS NOTE    Christopher Potts  PIR:[id number] DOB: October 07, 1945 DOA: 03/10/2017 PCP: [name], MD   Brief Narrative: 71 y.o.malewith medical history significant of carcinoid tumor of the appendix, malignant who was admitted last week at [facility] for "SBO, treated conservatively, still having N/V" as of 9/10, with recommendation to f/u with oncology for progression of disease seen on CT. His family reports that he is here with dehydration. "We think it's because of the immune therapy I've had 3 times. I was dehydrated down to nothing. 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 Started immunotherapy about 6 weeks ago, 1 treatmentevery other week (due Tuesday but he declined). He thinks the loose bowels and n/v with resultant dehydrationstarted right after the immunotherapy. "It tends to dry out everything". Each treatment it got worse. He was hospitaized last week in [place]; they stuck a tube down my throat and pumped out acid and put fluid in my veins. Eventually they took the tube out and were giving liquids." Admitted for Intractable N/V with severe AKI from Dehydration and Hypovolemia. Abdomen and pelvis done yesterday shows distended and fluid-filled distally saphenous, stomach and proximal small bowel indicates he wore for obstruction. Tiny right pleural effusion. Bilateral kidney stones bilateral hydronephrosis. Possible bladder wall thickening. Renal ultrasound showed bilateral hydronephrosis left more than right. CT scan of the abdomen did show bilateral hydronephrosis with increasing tumor burden. I appreciate it urology Dr. Diona Fanti for trying to put a stent in. I have seen the patient in PACU. Patient was started on Neo-Synephrine due to low blood pressure. Patient always had a low blood pressure systolic around 6301.  After prolonged day of discussion with the family wife, daughter I talkedthem in PACU .Patient was awake and alert though his blood pressure was low. He expresses express a desire to die at home. He was concerned about the bills. He was afraid to get hospice on board due to increasing bills. But he was agreeable to DO NOT RESUSCITATE DO NOT INTUBATE. I did speak with the PA with palliative care Stanton Kidney and Stanton Kidney did discuss with patient's wife multiple times. Finally the family and the patient has come to a conclusion to be DO NOT RESUSCITATE and will DC the Neo-Synephrine and send the patient back to the floor and will discuss about hospice tomorrow. NG tube in place.i appreciate all the consultants input and help with this challenging case.patient was due to be discharged today home with hospice.but they want b/l nephrostomy tubes and a venting gastrostomy tube placed for drainage prior to dc.     Assessment & Plan:   Principal Problem:   Acute renal failure (ARF) (HCC) Active Problems:   Carcinoid tumor of appendix   Prolonged QT interval   Nausea & vomiting   Hypokalemia   DNR (do not resuscitate) discussion   Palliative care by specialist  ARF/B/L HYDRONEPHROSIS METASTATIC GLOBAL CELL CARCINOMA APPENDIX SMALL BOWEL OBSTRUCTION  PLAN.Marland Kitchenpatient to dc home past placement of b/l nephrostomy tube and gastrostomy for comfort.    DVT prophylaxis: NONE Code Status: DNR Family Communication: DW WITH WIFE Disposition Plan: HOME WITH HOSPICE   Consultants:URO,NEPHRO,ONC  Procedures: NONE  Antimicrobials: NONE   Subjective:NGT draining a lot   Objective: Vitals:   03/14/17 0413 03/14/17 0826 03/14/17 1731 03/15/17 0900  BP: (!) 99/56 98/61 (!) 91/58 105/71  Pulse: 98 (!)  112 (!) 133 99  Resp: 16 18 18 18   Temp: 98.3 F (36.8 C) 97.6 F (36.4 C) 98.2 F (36.8 C) 97.8 F (36.6 C)  TempSrc: Oral Oral Oral Oral  SpO2: 95% 96% 98% 100%  Weight:      Height:         Intake/Output Summary (Last 24 hours) at 03/15/17 1202 Last data filed at 03/15/17 0700  Gross per 24 hour  Intake          2989.83 ml  Output             2500 ml  Net           489.83 ml   Filed Weights   03/12/17 2016 03/13/17 0834 03/13/17 2215  Weight: 60.8 kg (134 lb) 60.8 kg (134 lb) 64.9 kg (143 lb 1.6 oz)    Examination:  General exam: Appears calm and comfortable  Respiratory system: Clear to auscultation. Respiratory effort normal. Cardiovascular system: S1 & S2 heard, RRR. No JVD, murmurs, rubs, gallops or clicks. No pedal edema. Gastrointestinal system: Abdomen is nondistended, soft and tender. No organomegaly or masses felt. Normal bowel sounds heard. Central nervous system: Alert and oriented. No focal neurological deficits. Extremities: Symmetric 5 x 5 power. Skin: No rashes, lesions or ulcers Psychiatry: Judgement and insight appear normal. Mood & affect appropriate.     Data Reviewed: I have personally reviewed following labs and imaging studies  CBC:  Recent Labs Lab 03/10/17 1603 03/11/17 0713 03/12/17 0517 03/13/17 0208  WBC 6.8 7.5 5.5 7.9  NEUTROABS  --   --  5.0 7.2  HGB 12.8* 10.7* 12.1* 11.4*  HCT 38.6* 31.6* 36.6* 35.6*  MCV 84.3 83.8 85.1 86.2  PLT 239 169 166 893   Basic Metabolic Panel:  Recent Labs Lab 03/10/17 1603 03/11/17 0713 03/11/17 0830 03/11/17 1830 03/12/17 0517 03/13/17 0208  NA 129* 131*  --  133* 135 137  K 3.6 3.0*  --  3.6 5.3* 5.2*  CL 72* 76*  --  79* 86* 89*  CO2 41* 38*  --  39* 33* 32  GLUCOSE 128* 101*  --  122* 116* 77  BUN 86* 92*  --  90* 97* 105*  CREATININE 4.12* 5.36*  --  6.23* 6.33* 7.33*  CALCIUM 9.2 8.7*  --  8.6* 8.5* 8.5*  MG  --  2.4  --   --  2.1  --   PHOS  --   --  5.2*  --  4.7*  --    GFR: Estimated Creatinine Clearance: 8.5 mL/min (A) (by C-G formula based on SCr of 7.33 mg/dL (H)). Liver Function Tests:  Recent Labs Lab 03/12/17 0517  AST 40  ALT 20  ALKPHOS 51  BILITOT  1.6*  PROT 5.4*  ALBUMIN 2.8*   No results for input(s): LIPASE, AMYLASE in the last 168 hours. No results for input(s): AMMONIA in the last 168 hours. Coagulation Profile: No results for input(s): INR, PROTIME in the last 168 hours. Cardiac Enzymes:  Recent Labs Lab 03/12/17 0007  TROPONINI 0.08*   BNP (last 3 results) No results for input(s): PROBNP in the last 8760 hours. HbA1C: No results for input(s): HGBA1C in the last 72 hours. CBG: No results for input(s): GLUCAP in the last 168 hours. Lipid Profile: No results for input(s): CHOL, HDL, LDLCALC, TRIG, CHOLHDL, LDLDIRECT in the last 72 hours. Thyroid Function Tests: No results for input(s): TSH, T4TOTAL, FREET4, T3FREE, THYROIDAB in the last 72 hours.  Anemia Panel: No results for input(s): VITAMINB12, FOLATE, FERRITIN, TIBC, IRON, RETICCTPCT in the last 72 hours. Sepsis Labs: No results for input(s): PROCALCITON, LATICACIDVEN in the last 168 hours.  Recent Results (from the past 240 hour(s))  C difficile quick scan w PCR reflex     Status: None   Collection Time: 03/10/17 11:55 PM  Result Value Ref Range Status   C Diff antigen NEGATIVE NEGATIVE Final   C Diff toxin NEGATIVE NEGATIVE Final   C Diff interpretation No C. difficile detected.  Final  Surgical pcr screen     Status: None   Collection Time: 03/12/17  9:56 PM  Result Value Ref Range Status   MRSA, PCR NEGATIVE NEGATIVE Final   Staphylococcus aureus NEGATIVE NEGATIVE Final    Comment: (NOTE) The Xpert SA Assay (FDA approved for NASAL specimens in patients 57 years of age and older), is one component of a comprehensive surveillance program. It is not intended to diagnose infection nor to guide or monitor treatment.          Radiology Studies: Dg C-arm 1-60 Min-no Report  Result Date: 03/13/2017 Fluoroscopy was utilized by the requesting physician.  No radiographic interpretation.        Scheduled Meds: . enoxaparin (LOVENOX) injection  30  mg Subcutaneous Q24H  . multivitamin with minerals  1 tablet Oral Daily  . pantoprazole (PROTONIX) IV  40 mg Intravenous Q24H   Continuous Infusions: . sodium chloride Stopped (03/15/17 0337)  . sodium chloride 10 mL/hr at 03/13/17 6837     LOS: 5 days       Georgette Shell, MD [facility]  If 7PM-7AM, please contact night-coverage [email or web address] Password [facility] 03/15/2017, 12:02 PM

## 2017-03-15 NOTE — Progress Notes (Signed)
I was asked to comment on this patient's case regarding his disposition plan. He is agreeable to a discharge with hospice but is under the understanding that he will require a venting G-tube and nephrostomy tubes.  I discussed these procedures with him and he is well informed about having these procedures done. He understand it will not alter his cancer progress and likely prolong his life very minimally which could impact his comfort level. He understands all this and wants to have these procedures done prior to his discharge.   I recommend consulting interventional radiology for potentially placing nephrostomy tubes and a venting G-tube prior to his discharge.  Dr. Benay Spice will follow on Monday for any questions.

## 2017-03-16 DIAGNOSIS — Z66 Do not resuscitate: Secondary | ICD-10-CM

## 2017-03-16 MED ORDER — ENOXAPARIN SODIUM 30 MG/0.3ML ~~LOC~~ SOLN: 30.0000 mg | SUBCUTANEOUS | Status: DC

## 2017-03-16 NOTE — Progress Notes (Signed)
  This NP visited patient at the bedside as a follow up to initial  [place] last week.  Last week patient was minimally responsive and the situation was tenuous, and the main objective of his family was to get him home with expectation that he would die at home in a very short period of time.  However today patient is alert and oriented and fully engaged in conversation today.    He clearly details his understanding of his poor prognosis and non-curable cancer. Today he tells me his main focus of care is "more time".  He verbalizes that this will not be quality time but again quantity is the priority.  He is open to intervention of venting G-tube and nephrostomy tubes early this week.  At that time he is open to discharge home with hospice services.  Discussed with patient the importance of continued conversation with family and their  medical providers regarding overall plan of care and treatment options,  ensuring decisions are within the context of the patients values and GOCs.  Patient tells me he is comfortable and has no symptoms that need to be addressed today.  Questions and concerns addressed  Time in  0800       Time out   0845         Total  t ime spent on the unit was 45 minutes  Greater than 50% of the time was spent in counseling and coordination of care  Discussed with Dr Zigmund Daniel  Palliative medicine team will continue to support holistically  Wadie Lessen NP  Palliative Medicine Team Team Phone # [phone] Pager [phone]  Patient ID: Christopher Potts, male   DOB: 1946-03-10, 71 y.o.   MRN: [id number]

## 2017-03-16 NOTE — Progress Notes (Signed)
PROGRESS NOTE    Christopher Potts  XBD:[id number] DOB: 30-Jan-1946 DOA: 03/10/2017 PCP: [name], MD   Brief Narrative:  71 y.o.malewith medical history significant of carcinoid tumor of the appendix, malignant who was admitted last week at [facility] for "SBO, treated conservatively, still having N/V" as of 9/10, with recommendation to f/u with oncology for progression of disease seen on CT. His family reports that he is here with dehydration. "We think it's because of the immune therapy I've had 3 times. I was dehydrated down to nothing. 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 Started immunotherapy about 6 weeks ago, 1 treatmentevery other week (due Tuesday but he declined). He thinks the loose bowels and n/v with resultant dehydrationstarted right after the immunotherapy. "It tends to dry out everything". Each treatment it got worse. He was hospitaized last week in [place]; they stuck a tube down my throat and pumped out acid and put fluid in my veins. Eventually they took the tube out and were giving liquids." Admitted for Intractable N/V with severe AKI from Dehydration and Hypovolemia. Abdomen and pelvis done yesterday shows distended and fluid-filled distally saphenous, stomach and proximal small bowel indicates he wore for obstruction. Tiny right pleural effusion. Bilateral kidney stones bilateral hydronephrosis. Possible bladder wall thickening. Renal ultrasound showed bilateral hydronephrosis left more than right. CT scan of the abdomen did show bilateral hydronephrosis with increasing tumor burden. I appreciate it urology Dr. Diona Fanti for trying to put a stent in. I have seen the patient in PACU. Patient was started on Neo-Synephrine due to low blood pressure. Patient always had a low blood pressure systolic around 8341.  After prolonged day of discussion with the family wife, daughter I talkedthem in PACU .Patient was awake and alert though his blood pressure was low. He expresses express a desire to die at home. He was concerned about the bills. He was afraid to get hospice on board due to increasing bills. But he was agreeable to DO NOT RESUSCITATE DO NOT INTUBATE. I did speak with the PA with palliative care Stanton Kidney and [facility] Kidney did discuss with patient's wife multiple times. Finally the family and the patient has come to a conclusion to be DO NOT RESUSCITATE and will DC the Neo-Synephrine and send the patient back to the floor and will discuss about hospice tomorrow. NG tube in place.i appreciate all the consultants input and help with this challenging case.patient was due to be discharged today home with hospice.but they want b/l nephrostomy tubes and a venting gastrostomy tube placed for drainage prior to dc.    Assessment & Plan:   Principal Problem:   Acute renal failure (ARF) (HCC) Active Problems:   Carcinoid tumor of appendix   Prolonged QT interval   Nausea & vomiting   Hypokalemia   DNR (do not resuscitate) discussion   Palliative care by specialist   ARF/B/L HYDRONEPHROSIS METASTATIC GLOBAL CELL CARCINOMA APPENDIX SMALL BOWEL OBSTRUCTION   plan is to have gastrostomy tube tomorrow.and b/l nephrostomy tube on Tuesday.to be discharged home with hospice on tue or wed.  DVT prophylaxis: none Code Status:dnr Family Communication:none Disposition Plan:  Home after the above procedures are done.  Consultants IR.[place].  Procedures: PENDING  Antimicrobials:  NONE  Subjective:  Objective: Vitals:   03/14/17 0413 03/14/17 0826 03/14/17 1731 03/15/17 0900  BP: (!) 99/56 98/61 (!) 91/58 105/71  Pulse: 98 (!) 112 (!) 133 99  Resp: 16 18 18 18   Temp: 98.3 F (36.8 C) 97.6 F (36.4 C) 98.2 F (36.8 C) 97.8 F (36.6 C)  TempSrc: Oral Oral Oral Oral  SpO2: 95% 96% 98% 100%  Weight:       Height:        Intake/Output Summary (Last 24 hours) at 03/16/17 1330 Last data filed at 03/16/17 0734  Gross per 24 hour  Intake              360 ml  Output             6000 ml  Net            -5640 ml   Filed Weights   03/12/17 2016 03/13/17 0834 03/13/17 2215  Weight: 60.8 kg (134 lb) 60.8 kg (134 lb) 64.9 kg (143 lb 1.6 oz)    Examination:  General exam: Appears calm and comfortable  Respiratory system: Clear to auscultation. Respiratory effort normal. Cardiovascular system: S1 & S2 heard, RRR. No JVD, murmurs, rubs, gallops or clicks. No pedal edema. Gastrointestinal system: Abdomen is nondistended, soft and nontender. No organomegaly or masses felt. Normal bowel sounds heard.colostomy in place. Central nervous system: Alert and oriented. No focal neurological deficits. Extremities: Symmetric 5 x 5 power. Skin: No rashes, lesions or ulcers Psychiatry: Judgement and insight appear normal. Mood & affect appropriate.     Data Reviewed: I have personally reviewed following labs and imaging studies  CBC:  Recent Labs Lab 03/10/17 1603 03/11/17 0713 03/12/17 0517 03/13/17 0208  WBC 6.8 7.5 5.5 7.9  NEUTROABS  --   --  5.0 7.2  HGB 12.8* 10.7* 12.1* 11.4*  HCT 38.6* 31.6* 36.6* 35.6*  MCV 84.3 83.8 85.1 86.2  PLT 239 169 166 485   Basic Metabolic Panel:  Recent Labs Lab 03/10/17 1603 03/11/17 0713 03/11/17 0830 03/11/17 1830 03/12/17 0517 03/13/17 0208  NA 129* 131*  --  133* 135 137  K 3.6 3.0*  --  3.6 5.3* 5.2*  CL 72* 76*  --  79* 86* 89*  CO2 41* 38*  --  39* 33* 32  GLUCOSE 128* 101*  --  122* 116* 77  BUN 86* 92*  --  90* 97* 105*  CREATININE 4.12* 5.36*  --  6.23* 6.33* 7.33*  CALCIUM 9.2 8.7*  --  8.6* 8.5* 8.5*  MG  --  2.4  --   --  2.1  --   PHOS  --   --  5.2*  --  4.7*  --    GFR: Estimated Creatinine Clearance: 8.5 mL/min (A) (by C-G formula based on SCr of 7.33 mg/dL (H)). Liver Function Tests:  Recent Labs Lab 03/12/17 0517    AST 40  ALT 20  ALKPHOS 51  BILITOT 1.6*  PROT 5.4*  ALBUMIN 2.8*   No results for input(s): LIPASE, AMYLASE in the last 168 hours. No results for input(s): AMMONIA in the last 168 hours. Coagulation Profile:  Recent Labs Lab 03/15/17 1428  INR 1.48   Cardiac Enzymes:  Recent Labs Lab 03/12/17 0007  TROPONINI 0.08*   BNP (last 3 results) No results for input(s): PROBNP in the last 8760 hours. HbA1C: No results for input(s): HGBA1C in the last 72 hours. CBG: No results for input(s): GLUCAP in the last 168 hours. Lipid Profile: No results for input(s): CHOL, HDL, LDLCALC, TRIG, CHOLHDL, LDLDIRECT in the last 72 hours. Thyroid Function Tests: No results for  input(s): TSH, T4TOTAL, FREET4, T3FREE, THYROIDAB in the last 72 hours. Anemia Panel: No results for input(s): VITAMINB12, FOLATE, FERRITIN, TIBC, IRON, RETICCTPCT in the last 72 hours. Sepsis Labs: No results for input(s): PROCALCITON, LATICACIDVEN in the last 168 hours.  Recent Results (from the past 240 hour(s))  C difficile quick scan w PCR reflex     Status: None   Collection Time: 03/10/17 11:55 PM  Result Value Ref Range Status   C Diff antigen NEGATIVE NEGATIVE Final   C Diff toxin NEGATIVE NEGATIVE Final   C Diff interpretation No C. difficile detected.  Final  Surgical pcr screen     Status: None   Collection Time: 03/12/17  9:56 PM  Result Value Ref Range Status   MRSA, PCR NEGATIVE NEGATIVE Final   Staphylococcus aureus NEGATIVE NEGATIVE Final    Comment: (NOTE) The Xpert SA Assay (FDA approved for NASAL specimens in patients 72 years of age and older), is one component of a comprehensive surveillance program. It is not intended to diagnose infection nor to guide or monitor treatment.          Radiology Studies: No results found.      Scheduled Meds: . [START ON 03/18/2017] enoxaparin (LOVENOX) injection  30 mg Subcutaneous Q24H  . multivitamin with minerals  1 tablet Oral Daily  .  pantoprazole (PROTONIX) IV  40 mg Intravenous Q24H   Continuous Infusions: . sodium chloride 10 mL/hr at 03/13/17 0086     LOS: 6 days     Georgette Shell, MD [facility]  If 7PM-7AM, please contact night-coverage [email or web address] Password [email or web address] 03/16/2017, 1:30 PM
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
## 2017-03-16 NOTE — Progress Notes (Signed)
Patient ID: Christopher Potts, male   DOB: 05-Dec-1945, 71 y.o.   MRN: [id number]   Bilateral percutaneous nephrostomy tube placement has been approved with [name].  Will place G tube 9/24 And plan for B PCN 9/25  Pt and MD aware and agreeable

## 2017-03-17 LAB — BASIC METABOLIC PANEL
Anion gap: 18 — ABNORMAL HIGH (ref 5–15)
BUN: 141 mg/dL — AB (ref 6–20)
CHLORIDE: 105 mmol/L (ref 101–111)
CO2: 18 mmol/L — AB (ref 22–32)
Calcium: 8.2 mg/dL — ABNORMAL LOW (ref 8.9–10.3)
Creatinine, Ser: 10.82 mg/dL — ABNORMAL HIGH (ref 0.61–1.24)
GFR calc Af Amer: 5 mL/min — ABNORMAL LOW (ref >60)
GFR, EST NON AFRICAN AMERICAN: 4 mL/min — AB (ref >60)
GLUCOSE: 65 mg/dL (ref 65–99)
POTASSIUM: 4.5 mmol/L (ref 3.5–5.1)
Sodium: 141 mmol/L (ref 135–145)

## 2017-03-17 LAB — CREATININE, SERUM
Creatinine, Ser: 10.8 mg/dL — ABNORMAL HIGH (ref 0.61–1.24)
GFR calc non Af Amer: 4 mL/min — ABNORMAL LOW (ref >60)
GFR, EST AFRICAN AMERICAN: 5 mL/min — AB (ref >60)

## 2017-03-17 MED ORDER — ENOXAPARIN SODIUM 30 MG/0.3ML ~~LOC~~ SOLN
30.0000 mg | SUBCUTANEOUS | Status: DC
  Filled 2017-03-17: qty 0.3

## 2017-03-17 NOTE — Care Management Important Message (Signed)
Important Message  Patient Details  Name: Christopher Potts MRN: [id number] Date of Birth: Feb 05, 1946   Medicare Important Message Given:  Yes    Nathen May 03/17/2017, 9:31 AM

## 2017-03-17 NOTE — Progress Notes (Signed)
Dr. Zigmund Daniel notified IR unable to do surgery today due to emergent case that came up. Verbal order received for clear liquid diet with NPO past midnight. Pt and wife updated on POC. Will continue to monitor.

## 2017-03-17 NOTE — Progress Notes (Signed)
PROGRESS NOTE    Christopher Potts  YPP:[id number] DOB: 11/05/1945 DOA: 03/10/2017 PCP: [name], MD    Brief Narrative: 71 y.o.malewith medical history significant of carcinoid tumor of the appendix, malignant who was admitted last week at [facility] for "SBO, treated conservatively, still having N/V" as of 9/10, with recommendation to f/u with oncology for progression of disease seen on CT. His family reports that he is here with dehydration. "We think it's because of the immune therapy I've had 3 times. I was dehydrated down to nothing. 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 Started immunotherapy about 6 weeks ago, 1 treatmentevery other week (due Tuesday but he declined). He thinks the loose bowels and n/v with resultant dehydrationstarted right after the immunotherapy. "It tends to dry out everything". Each treatment it got worse. He was hospitaized last week in [place]; they stuck a tube down my throat and pumped out acid and put fluid in my veins. Eventually they took the tube out and were giving liquids." Admitted for Intractable N/V with severe AKI from Dehydration and Hypovolemia. Abdomen and pelvis done yesterday shows distended and fluid-filled distally saphenous, stomach and proximal small bowel indicates he wore for obstruction. Tiny right pleural effusion. Bilateral kidney stones bilateral hydronephrosis. Possible bladder wall thickening. Renal ultrasound showed bilateral hydronephrosis left more than right. CT scan of the abdomen did show bilateral hydronephrosis with increasing tumor burden. I appreciate it urology Dr. Diona Fanti for trying to put a stent in. I have seen the patient in PACU. Patient was started on Neo-Synephrine due to low blood pressure. Patient always had a low blood pressure systolic around 4580.  After prolonged day of discussion with the family wife, daughter I talkedthem in PACU .Patient was awake and alert though his blood pressure was low. He expresses express a desire to die at home. He was concerned about the bills. He was afraid to get hospice on board due to increasing bills. But he was agreeable to DO NOT RESUSCITATE DO NOT INTUBATE. I did speak with the PA with palliative care Stanton Kidney and Stanton Kidney did discuss with patient's wife multiple times. Finally the family and the patient has come to a conclusion to be DO NOT RESUSCITATE and will DC the Neo-Synephrine and send the patient back to the floor and will discuss about hospice tomorrow. NG tube in place.i appreciate all the consultants input and help with this challenging case.patient was due to be discharged today home with hospice.but they want b/l nephrostomy tubes and a venting gastrostomy tube placed for drainage prior to dc. APPRECIATE ALL THE CONSULTANTS INPUT AND HELP.   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 A bmp WAS DONE TODAY. hIS CREATININE IS ABOVE 10 SURPRISINGLY HIS POTASSIUM IS STILL WITHIN NORMAL LIMITS. pLAN IS FOR HIM TO HAVE GASTROSTOMY TUBE PLACED TODAY AND TO HAVE BILATERAL NEPHROSTOMY TUBES DONE TOMORROW AND TO GO HOME ON WEDNESDAY  WITH HOSPICE. METASTATIC GLOBAL CELL CARCINOMA APPENDIX SMALL BOWEL OBSTRUCTIONPATIENT  NOW HAS AN ng TUBE IN PLACE.   DVT prophylaxis:NONE Code Status: do not resuscitate Family Communication: dISCUSSED WITH PATIENT AND WIFE Disposition Plan IF ALL GOES WELL DISCHARGE THE PATIENT ON  Wednesday  HOME WITH HOSPICE   Consultants: NEPHROLOGY, INTERVENTIONAL RADIOLOGY, ONCOLOGY, UROLOGY.  Procedures:GASTROSTOMY TUBE PLACEMENT AND NEPHROSTOMY TUBE PLACEMENT PENDING.  Antimicrobials NONE  Subjective:FEELING WELL NO NEW COMPLAINTS WANTS TO HAVE THE PROCEDURE DONE AND THEN GO HOME   Objective: Vitals:   03/14/17 0826 03/14/17 1731 03/15/17 0900 03/17/17 0700  BP: 98/61 (!) 91/58 105/71 106/71  Pulse: (!) 112 (!) 133 99 (!) 103  Resp: 18 18 18 20   Temp: 97.6 F (36.4 C) 98.2 F (36.8 C) 97.8 F (36.6 C) (!) 97.4 F (36.3 C)  TempSrc: Oral Oral Oral Oral  SpO2: 96% 98% 100% 100%  Weight:      Height:        Intake/Output Summary (Last 24 hours) at 03/17/17 1534 Last data filed at 03/17/17 1522  Gross per 24 hour  Intake              640 ml  Output             3700 ml  Net            -3060 ml   Filed Weights   03/12/17 2016 03/13/17 0834 03/13/17 2215  Weight: 60.8 kg (134 lb) 60.8 kg (134 lb) 64.9 kg (143 lb 1.6 oz)    Examination:  General exam: Appears calm and comfortable  Respiratory system: Clear to auscultation. Respiratory effort normal. Cardiovascular system: S1 & S2 heard, RRR. No JVD, murmurs, rubs, gallops or clicks. No pedal edema. Gastrointestinal system: Abdomen is nondistended, soft and tender. No organomegaly or masses felt. Normal bowel sounds heard.colostomy in place. Central nervous system: Alert and oriented. No focal neurological deficits. Extremities: Symmetric 5 x 5 power. Skin: No rashes, lesions or ulcers Psychiatry: Judgement and insight appear normal. Mood & affect appropriate.     Data Reviewed: I have personally reviewed following labs and imaging studies  CBC:  Recent Labs Lab 03/10/17 1603 03/11/17 0713 03/12/17 0517 03/13/17 0208  WBC 6.8 7.5 5.5 7.9  NEUTROABS  --   --  5.0 7.2  HGB 12.8* 10.7* 12.1* 11.4*  HCT 38.6* 31.6* 36.6* 35.6*  MCV 84.3 83.8 85.1 86.2  PLT 239 169 166 161   Basic Metabolic  Panel:  Recent Labs Lab 03/11/17 0713 03/11/17 0830 03/11/17 1830 03/12/17 0517 03/13/17 0208 03/17/17 0543 03/17/17 0931  NA 131*  --  133* 135 137  --  141  K 3.0*  --  3.6 5.3* 5.2*  --  4.5  CL 76*  --  79* 86* 89*  --  105  CO2 38*  --  39* 33* 32  --  18*  GLUCOSE 101*  --  122* 116* 77  --  65  BUN 92*  --  90* 97* 105*  --  141*  CREATININE 5.36*  --  6.23* 6.33* 7.33* 10.80* 10.82*  CALCIUM 8.7*  --  8.6* 8.5* 8.5*  --  8.2*  MG 2.4  --   --  2.1  --   --   --   PHOS  --  5.2*  --  4.7*  --   --   --    GFR: Estimated Creatinine Clearance: 5.7 mL/min (A) (by C-G formula based on SCr of 10.82 mg/dL (H)). Liver Function Tests:  Recent Labs Lab 03/12/17  0517  AST 40  ALT 20  ALKPHOS 51  BILITOT 1.6*  PROT 5.4*  ALBUMIN 2.8*   No results for input(s): LIPASE, AMYLASE in the last 168 hours. No results for input(s): AMMONIA in the last 168 hours. Coagulation Profile:  Recent Labs Lab 03/15/17 1428  INR 1.48   Cardiac Enzymes:  Recent Labs Lab 03/12/17 0007  TROPONINI 0.08*   BNP (last 3 results) No results for input(s): PROBNP in the last 8760 hours. HbA1C: No results for input(s): HGBA1C in the last 72 hours. CBG: No results for input(s): GLUCAP in the last 168 hours. Lipid Profile: No results for input(s): CHOL, HDL, LDLCALC, TRIG, CHOLHDL, LDLDIRECT in the last 72 hours. Thyroid Function Tests: No results for input(s): TSH, T4TOTAL, FREET4, T3FREE, THYROIDAB in the last 72 hours. Anemia Panel: No results for input(s): VITAMINB12, FOLATE, FERRITIN, TIBC, IRON, RETICCTPCT in the last 72 hours. Sepsis Labs: No results for input(s): PROCALCITON, LATICACIDVEN in the last 168 hours.  Recent Results (from the past 240 hour(s))  C difficile quick scan w PCR reflex     Status: None   Collection Time: 03/10/17 11:55 PM  Result Value Ref Range Status   C Diff antigen NEGATIVE NEGATIVE Final   C Diff toxin NEGATIVE NEGATIVE Final   C Diff  interpretation No C. difficile detected.  Final  Surgical pcr screen     Status: None   Collection Time: 03/12/17  9:56 PM  Result Value Ref Range Status   MRSA, PCR NEGATIVE NEGATIVE Final   Staphylococcus aureus NEGATIVE NEGATIVE Final    Comment: (NOTE) The Xpert SA Assay (FDA approved for NASAL specimens in patients 71 years of age and older), is one component of a comprehensive surveillance program. It is not intended to diagnose infection nor to guide or monitor treatment.          Radiology Studies: No results found.      Scheduled Meds: . [START ON 03/18/2017] enoxaparin (LOVENOX) injection  30 mg Subcutaneous Q24H  . multivitamin with minerals  1 tablet Oral Daily  . pantoprazole (PROTONIX) IV  40 mg Intravenous Q24H   Continuous Infusions: . sodium chloride 10 mL/hr at 03/13/17 9604     LOS: 7 days      Georgette Shell, MD [facility]  If 7PM-7AM, please contact night-coverage [email or web address] Password [facility] 03/17/2017, 3:34 PM

## 2017-03-17 NOTE — Progress Notes (Signed)
IP PROGRESS NOTE  Subjective:   Christopher Potts has no  this morning. No pain or nausea. He has decided to proceed with placement of a palliative gastrostomy tube and nephrostomy tubes. He plans to go home with Hospice care  Objective: Vital signs in last 24 hours: Blood pressure 105/71, pulse 99, temperature 97.8 F (36.6 C), temperature source Oral, resp. rate 18, height 6\' 1"  (1.854 m), weight 143 lb 1.6 oz (64.9 kg), SpO2 100 %.  Intake/Output from previous day: 09/23 0701 - 09/24 0700 In: 240 [P.O.:240] Out: 3000 [Emesis/NG output:3000]  Physical Exam: Not performed today   Portacath/PICC-without erythema  Lab Results:  BMET  Recent Labs  03/17/17 0543  CREATININE 10.80*     Medications: I have reviewed the patient's current medications.  Assessment/Plan:  1. 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 Acute renal failure secondary to obstructive uropathy  Cystoscopy 03/13/2017 confirmed tumor involving the bladder, unable to place ureter stents 3. Small bowel obstruction, NG tube in place 4. Weight loss/failure to thrive secondary to #1  Christopher Potts appears comfortable. After discussion with interventional radiology, pelvic care, and the internal medicine team he has decided to proceed with a palliative gastrostomy and placement of nephrostomy tubes. He has a goal of returning home. He would like me to serve as the primary physician with Hospice at discharge.    Recommendations:  1. Palliative gastrostomy and nephrostomy tube he is deemed a candidate for these procedures 2. Consider full chemistry panel and nephrology evaluation for input on medical therapy for the renal failure     LOS: 7 days   Donneta Romberg, MD   03/17/2017, 8:30 AM

## 2017-03-17 NOTE — Progress Notes (Signed)
I was called for "medical management of renal failure" nephrology had followed this patient earlier in the course. It was felt that his renal failure was secondary to obstruction. Ureteral stenting was not technically possible due  to large tumor burden. Initially the plan was to go home with hospice. Now the plan is changed and the plan is for palliative percutaneous nephrostomy tubes. We feel that the obstruction is the etiology for his renal failure. Relief of the obstruction may change his prognosis from days to weeks. We still would not offer dialysis in this situation. Therefore, I agree with the percutaneous nephrostomy tubes and I don't think that anything else needs to be done at this time regarding his renal failure.   Sherrell Weir A

## 2017-03-17 NOTE — Progress Notes (Signed)
Pt. Expressed interest in possible kidney transplant. I stated this was a topic to discuss with provider and didn't give any answers in regards to eligibility for surgery. May need further consultation with provider regarding kidney transplant eligibility.

## 2017-03-18 ENCOUNTER — Inpatient Hospital Stay (HOSPITAL_COMMUNITY): Payer: Medicare HMO

## 2017-03-18 DIAGNOSIS — K56609 Unspecified intestinal obstruction, unspecified as to partial versus complete obstruction: Secondary | ICD-10-CM

## 2017-03-18 HISTORY — PX: IR GASTROSTOMY TUBE MOD SED: IMG625

## 2017-03-18 LAB — RENAL FUNCTION PANEL
ALBUMIN: 2.2 g/dL — AB (ref 3.5–5.0)
ANION GAP: 16 — AB (ref 5–15)
BUN: 145 mg/dL — ABNORMAL HIGH (ref 6–20)
CALCIUM: 8.2 mg/dL — AB (ref 8.9–10.3)
CO2: 18 mmol/L — AB (ref 22–32)
Chloride: 104 mmol/L (ref 101–111)
Creatinine, Ser: 12.01 mg/dL — ABNORMAL HIGH (ref 0.61–1.24)
GFR, EST AFRICAN AMERICAN: 4 mL/min — AB (ref >60)
GFR, EST NON AFRICAN AMERICAN: 4 mL/min — AB (ref >60)
Glucose, Bld: 72 mg/dL (ref 65–99)
PHOSPHORUS: 6.7 mg/dL — AB (ref 2.5–4.6)
Potassium: 4.3 mmol/L (ref 3.5–5.1)
SODIUM: 138 mmol/L (ref 135–145)

## 2017-03-18 LAB — MAGNESIUM: MAGNESIUM: 2.4 mg/dL (ref 1.7–2.4)

## 2017-03-18 MED ORDER — CEFAZOLIN SODIUM-DEXTROSE 1-4 GM/50ML-% IV SOLN
1.0000 g | INTRAVENOUS | Status: DC
  Filled 2017-03-18: qty 50

## 2017-03-18 MED ORDER — CEFAZOLIN SODIUM-DEXTROSE 2-4 GM/100ML-% IV SOLN: 2.0000 g | Freq: Three times a day (TID) | INTRAVENOUS | Status: DC

## 2017-03-18 MED ORDER — CEFAZOLIN SODIUM-DEXTROSE 2-4 GM/100ML-% IV SOLN: 2.0000 g | Freq: Once | INTRAVENOUS | Status: DC

## 2017-03-18 MED ORDER — LIP MEDEX EX OINT
TOPICAL_OINTMENT | CUTANEOUS | Status: DC | PRN
  Filled 2017-03-18: qty 7

## 2017-03-18 MED ORDER — SODIUM CHLORIDE 0.9% FLUSH
10.0000 mL | INTRAVENOUS | Status: DC | PRN
  Administered 2017-03-18: 30 mL
  Administered 2017-03-21: 20 mL
  Filled 2017-03-18 (×2): qty 40

## 2017-03-18 MED ORDER — LIDOCAINE HCL (PF) 1 % IJ SOLN
INTRAMUSCULAR | Status: AC | PRN
  Administered 2017-03-18: 5 mL

## 2017-03-18 MED ORDER — MIDAZOLAM HCL 2 MG/2ML IJ SOLN
INTRAMUSCULAR | Status: AC
  Filled 2017-03-18: qty 6

## 2017-03-18 MED ORDER — LIDOCAINE HCL (PF) 1 % IJ SOLN
INTRAMUSCULAR | Status: AC
  Filled 2017-03-18: qty 20

## 2017-03-18 MED ORDER — CEFAZOLIN SODIUM-DEXTROSE 2-4 GM/100ML-% IV SOLN
2.0000 g | Freq: Three times a day (TID) | INTRAVENOUS | Status: DC
  Administered 2017-03-18: 2000 mg via INTRAVENOUS

## 2017-03-18 MED ORDER — MIDAZOLAM HCL 2 MG/2ML IJ SOLN
INTRAMUSCULAR | Status: AC | PRN
  Administered 2017-03-18 (×2): 1 mg via INTRAVENOUS

## 2017-03-18 MED ORDER — IOPAMIDOL (ISOVUE-300) INJECTION 61%
INTRAVENOUS | Status: AC
  Administered 2017-03-18: 10 mL
  Filled 2017-03-18: qty 100

## 2017-03-18 MED ORDER — FENTANYL CITRATE (PF) 100 MCG/2ML IJ SOLN
INTRAMUSCULAR | Status: AC | PRN
  Administered 2017-03-18: 50 ug via INTRAVENOUS

## 2017-03-18 MED ORDER — FENTANYL CITRATE (PF) 100 MCG/2ML IJ SOLN
INTRAMUSCULAR | Status: AC
  Filled 2017-03-18: qty 6

## 2017-03-18 MED ORDER — CEFAZOLIN SODIUM-DEXTROSE 2-4 GM/100ML-% IV SOLN
INTRAVENOUS | Status: AC
  Administered 2017-03-18: 2000 mg via INTRAVENOUS
  Filled 2017-03-18: qty 100

## 2017-03-18 NOTE — Care Management Note (Addendum)
Case Management Note  Patient Details  Name: Christopher Potts MRN: [id number] Date of Birth: [birth year]  Subjective/Objective:     CM continuing to follow for progression. Hospice home services to be provided by [facility].             Please notify Lattie Haw of [facility] of pt d/c [phone]  Action/Plan:  Adron Bene, RN Case Manager Addendum CASE MANAGEMENT Care Management Note Date of Service: 03/14/2017 2:49 PM      [] Hide copied text [] Hover for attribution information Case Management Note  Patient Details  Name: Christopher Potts MRN: [id number] Date of Birth: [birth year]  Subjective/Objective:      CM following for progression and d/c planning.              Action/Plan: 03/14/2017 Notified that this pt and family have selected home hospice services with [facility]. This CM spoke with pt and family. Pt wishes for contact person to be his daughter [name] @ [phone]. Pt states that he has a hospital bed and a walker at home. This CM contacted [facility] and faxed info to [employer] at that agency. 2:50 pm Spoke with Lovenia Shuck who states that the faxed materials have arrived and that she will sent to the admitting nurse for review. Await confirmation of home hospice availability. Pt and family informed.  4:05 pm Received a call from [facility], who states that they will be able to provide [facility] services for this pt. [name] informed and plans to d/c this pt tomorrow, 03/15/2017.  03/18/2017 Await placement of venting G tube and bil nephrostomy tubes. Spoke with Lattie Haw of [facility]. Please  Notify her of d/c when pt is ready.   Expected Discharge Date:                         Expected Discharge Plan:  Home w Hospice Care  In-House Referral:  Clinical Social Work  Discharge planning Services  CM Consult  Post Acute Care Choice:  Hospice Choice offered to:   Spouse, Patient, Adult Children  DME Arranged:  N/A DME Agency:  NA  HH Arranged:  RN, Nurse's Aide, Social Work [employer] Agency:  Other - See comment ([facility]. )  Status of Service:  In process, will continue to follow  If discussed at Long Length of Stay Meetings, dates discussed:    Additional Comments:  Adron Bene, RN 03/14/2017, 2:49 PM     Revision History                    Expected Discharge Date:                  Expected Discharge Plan:  Home w Hospice Care  In-House Referral:  Clinical Social Work  Discharge planning Services  CM Consult  Post Acute Care Choice:  Hospice Choice offered to:  Spouse, Patient, Adult Children  DME Arranged:  N/A DME Agency:  NA  HH Arranged:  RN, Nurse's Aide, Social Work [employer] Agency:  Other - See comment ([facility]. )  Status of Service:  In process, will continue to follow  If discussed at Long Length of Stay Meetings, dates discussed:    Additional Comments:  Adron Bene, RN 03/18/2017, 11:30  AM

## 2017-03-18 NOTE — Progress Notes (Addendum)
IP PROGRESS NOTE  Subjective:   Mr. Mcgillis did not undergo placement of the gastrostomy tube yesterday. He feels dry as he has been nothing by mouth and without IV fluids. He would like to proceed with gastrostomy and nephrostomy tube placements.  Objective: Vital signs in last 24 hours: Blood pressure 97/67, pulse 96, temperature 97.6 F (36.4 C), temperature source Oral, resp. rate 20, height 6\' 1"  (1.854 m), weight 143 lb 1.6 oz (64.9 kg), SpO2 98 %.  Intake/Output from previous day: 09/24 0701 - 09/25 0700 In: [place] [P.O.:1120; I.V.:730] Out: 2930 [Urine:1000; Emesis/NG output:1900; Stool:30]  Physical Exam:  HEENT: The mouth is dry, no thrush Lungs: Clear anteriorly Cardiac: Regular rate and rhythm Abdomen: Firmness in the mid abdomen, left lower quadrant colostomy with a small amount of liquid stool Vascular: Trace pitting edema at the low leg bilaterally   Portacath/PICC-without erythema  Lab Results:  BMET  Recent Labs  03/17/17 0543 03/17/17 0931  NA  --  141  K  --  4.5  CL  --  105  CO2  --  18*  GLUCOSE  --  65  BUN  --  141*  CREATININE 10.80* 10.82*  CALCIUM  --  8.2*     Medications: I have reviewed the patient's current medications.  Assessment/Plan:  1. 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 Acute renal failure secondary to obstructive uropathy  Cystoscopy 03/13/2017 confirmed tumor involving the bladder, unable to place ureter stents 3. Small bowel obstruction, NG tube in place 4. Weight loss/failure to thrive secondary to #1  Mr. Aspinall appears unchanged. I increase the IV fluids since he has been without fluids for the past day. He understands the gastrostomy and nephrostomy tube placements are palliative. There is no guarantee placement of nephrostomy tubes will reverse the renal failure,  though it is possible it could extend his life by weeks.  Recommendations:  1. Palliative gastrostomy and nephrostomy tubes in interventional radiology 2. Intravenous hydration while in the hospital 3. Arrange for Home hospice care.` 4. Stop Lovenox with the renal failure and planned procedures    LOS: 8 days   Donneta Romberg, MD   03/18/2017, 9:35 AM

## 2017-03-18 NOTE — Progress Notes (Signed)
PROGRESS NOTE    Christopher Potts  XBJ:[id number] DOB: 05-03-1946 DOA: 03/10/2017 PCP: [name], MD    Brief Narrative: 71 y.o.malewith medical history significant of carcinoid tumor of the appendix, malignant who was admitted last week at [facility] for "SBO, treated conservatively, still having N/V" as of 9/10, with recommendation to f/u with oncology for progression of disease seen on CT. His family reports that he is here with dehydration. "We think it's because of the immune therapy I've had 3 times. I was dehydrated down to nothing. 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 Started immunotherapy about 6 weeks ago, 1 treatmentevery other week (due Tuesday but he declined). He thinks the loose bowels and n/v with resultant dehydrationstarted right after the immunotherapy. "It tends to dry out everything". Each treatment it got worse. He was hospitaized last week in [place]; they stuck a tube down my throat and pumped out acid and put fluid in my veins. Eventually they took the tube out and were giving liquids." Admitted for Intractable N/V with severe AKI from Dehydration and Hypovolemia. Abdomen and pelvis done yesterday shows distended and fluid-filled distally saphenous, stomach and proximal small bowel indicates he wore for obstruction. Tiny right pleural effusion. Bilateral kidney stones bilateral hydronephrosis. Possible bladder wall thickening. Renal ultrasound showed bilateral hydronephrosis left more than right. CT scan of the abdomen did show bilateral hydronephrosis with increasing tumor burden. I appreciate it urology Dr. Diona Fanti for trying to put a stent in. I have seen the patient in PACU. Patient was started on Neo-Synephrine due to low blood pressure. Patient always had a low blood pressure systolic around 3086.  After prolonged day of discussion with the family wife, daughter I talkedthem in PACU .Patient was awake and alert though his blood pressure was low. He expresses express a desire to die at home. He was concerned about the bills. He was afraid to get hospice on board due to increasing bills. But he was agreeable to DO NOT RESUSCITATE DO NOT INTUBATE. I did speak with the PA with palliative care Stanton Kidney and Stanton Kidney did discuss with patient's wife multiple times. Finally the family and the patient has come to a conclusion to be DO NOT RESUSCITATE and will DC the Neo-Synephrine and send the patient back to the floor and will discuss about hospice tomorrow. NG tube in place.i appreciate all the consultants input and help with this challenging case.patient was due to be discharged today home with hospice.but they want b/l nephrostomy tubes and a venting gastrostomy tube placed for drainage prior to dc. APPRECIATE ALL THE CONSULTANTS INPUT AND HELP.   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 METASTATIC GLOBAL CELL CARCINOMA APPENDIX SMALL BOWEL OBSTRUCTIONPATIENT NOW HAS AN NG TUBE IN PLACE.   DVT prophylaxis:NONE Code Status: do not resuscitate Family Communication: DISCUSSED  WITH PATIENT AND WIFE Disposition Plan IF ALL GOES WELL DISCHARGE THE PATIENT ON Wednesday  HOME WITH HOSPICE   Consultants: NEPHROLOGY, INTERVENTIONAL RADIOLOGY, ONCOLOGY,  UROLOGY.  Procedures:GASTROSTOMY TUBE PLACEMENT AND NEPHROSTOMY TUBE PLACEMENT PENDING.  Antimicrobials NONE  Subjective:FEELING WELL NO NEW COMPLAINTS WANTS TO HAVE THE PROCEDURE DONE AND THEN GO HOME   Objective: Vitals:   03/18/17 0730 03/18/17 1618 03/18/17 1630 03/18/17 1635  BP: 97/67 116/71 112/81 108/80  Pulse: 96 94 95 97  Resp: 20 10 10 12   Temp: 97.6 F (36.4 C)     TempSrc: Oral     SpO2: 98% 98% 97% 96%  Weight:      Height:        Intake/Output Summary (Last 24 hours) at 03/18/17 1638 Last data filed at 03/18/17 1200  Gross per 24 hour  Intake             1450 ml  Output             2000 ml  Net             -550 ml   Filed Weights   03/12/17 2016 03/13/17 0834 03/13/17 2215  Weight: 60.8 kg (134 lb) 60.8 kg (134 lb) 64.9 kg (143 lb 1.6 oz)    Examination:  General exam: Appears calm and comfortable  Respiratory system: Clear to auscultation. Respiratory effort normal. Cardiovascular system: S1 & S2 heard, RRR. No JVD, murmurs, rubs, gallops or clicks. No pedal edema. Gastrointestinal system: Abdomen is nondistended, soft and tender. No organomegaly or masses felt. Normal bowel sounds heard.colostomy in place. Central nervous system: Alert and oriented. No focal neurological deficits. Extremities: Symmetric 5 x 5 power. Skin: No rashes, lesions or ulcers Psychiatry: Judgement and insight appear normal. Mood & affect appropriate.     Data Reviewed: I have personally reviewed following labs and imaging studies  CBC:  Recent Labs Lab 03/12/17 0517 03/13/17 0208  WBC 5.5 7.9  NEUTROABS 5.0 7.2  HGB 12.1* 11.4*  HCT 36.6* 35.6*  MCV 85.1 86.2  PLT 166 573   Basic Metabolic Panel:  Recent Labs Lab 03/11/17 1830 03/12/17 0517 03/13/17 0208 03/17/17 0543 03/17/17 0931  NA 133* 135 137  --  141  K 3.6 5.3* 5.2*  --  4.5  CL 79* 86* 89*  --  105  CO2 39* 33* 32  --  18*  GLUCOSE 122* 116* 77  --  65  BUN 90* 97* 105*  --  141*   CREATININE 6.23* 6.33* 7.33* 10.80* 10.82*  CALCIUM 8.6* 8.5* 8.5*  --  8.2*  MG  --  2.1  --   --   --   PHOS  --  4.7*  --   --   --    GFR: Estimated Creatinine Clearance: 5.7 mL/min (A) (by C-G formula based on SCr of 10.82 mg/dL (H)). Liver Function Tests:  Recent Labs Lab 03/12/17 0517  AST 40  ALT 20  ALKPHOS 51  BILITOT 1.6*  PROT 5.4*  ALBUMIN 2.8*   No results for input(s): LIPASE, AMYLASE in the last 168 hours. No results for input(s): AMMONIA in the last 168 hours. Coagulation Profile:  Recent Labs Lab 03/15/17 1428  INR 1.48   Cardiac Enzymes:  Recent Labs Lab 03/12/17 0007  TROPONINI 0.08*   BNP (last 3 results) No results for input(s): PROBNP in the last 8760 hours. HbA1C: No results for input(s): HGBA1C in the last 72 hours. CBG: No results for  input(s): GLUCAP in the last 168 hours. Lipid Profile: No results for input(s): CHOL, HDL, LDLCALC, TRIG, CHOLHDL, LDLDIRECT in the last 72 hours. Thyroid Function Tests: No results for input(s): TSH, T4TOTAL, FREET4, T3FREE, THYROIDAB in the last 72 hours. Anemia Panel: No results for input(s): VITAMINB12, FOLATE, FERRITIN, TIBC, IRON, RETICCTPCT in the last 72 hours. Sepsis Labs: No results for input(s): PROCALCITON, LATICACIDVEN in the last 168 hours.  Recent Results (from the past 240 hour(s))  C difficile quick scan w PCR reflex     Status: None   Collection Time: 03/10/17 11:55 PM  Result Value Ref Range Status   C Diff antigen NEGATIVE NEGATIVE Final   C Diff toxin NEGATIVE NEGATIVE Final   C Diff interpretation No C. difficile detected.  Final  Surgical pcr screen     Status: None   Collection Time: 03/12/17  9:56 PM  Result Value Ref Range Status   MRSA, PCR NEGATIVE NEGATIVE Final   Staphylococcus aureus NEGATIVE NEGATIVE Final    Comment: (NOTE) The Xpert SA Assay (FDA approved for NASAL specimens in patients 67 years of age and older), is one component of a  comprehensive surveillance program. It is not intended to diagnose infection nor to guide or monitor treatment.          Radiology Studies: No results found.      Scheduled Meds: . iopamidol      . lidocaine (PF)      . multivitamin with minerals  1 tablet Oral Daily  . pantoprazole (PROTONIX) IV  40 mg Intravenous Q24H   Continuous Infusions: . sodium chloride 150 mL/hr at 03/18/17 0745  .  ceFAZolin (ANCEF) IV       LOS: 8 days      Elwin Mocha, MD [facility]  If 7PM-7AM, please contact night-coverage [email or web address] Password [facility] 03/18/2017, 4:38 PM

## 2017-03-18 NOTE — Progress Notes (Signed)
Nutrition Follow-up  DOCUMENTATION CODES:   Severe malnutrition in context of chronic illness, Underweight  INTERVENTION:   -Oral diet as tolerated post G-tube placement for pleasure  -Due to life expectancy in weeks per MD notes, nutrition support not warranted at this time.  However, if GOC warrant nutrition support, recommend initiation of TPN  NUTRITION DIAGNOSIS:   Malnutrition (Severe) related to chronic illness, cancer and cancer related treatments as evidenced by severe depletion of muscle mass, severe depletion of body fat.  Continues  GOAL:   Patient will meet greater than or equal to 90% of their needs  Not met  MONITOR:   PO intake, Supplement acceptance, Labs, Weight trends, Diet advancement  REASON FOR ASSESSMENT:   Malnutrition Screening Tool    ASSESSMENT:   71 yo male admitted with ARF likely related to dehydration from poor PO intake and increase stool output associated with appendiceal cancer. Pt diagnosed with appendiceal cancer 05/2015. Recent CT showed progression of peritoneal mets. Noted pt admitted with [facility] last week for an SBO that was treated medically. Pt started immunotherapy 6 weeks ago.  Palliative care following. Noted plan to go home with hospice tomorrow  Plan for G-tube placement for decompression and bilateral nephrostomy tubes today, IR placement  Per oncology notes, "there is no guarantee placement of nephrostomy tubes will reverse the renal failure, though it is possible it could extend his life by weeks."  NPO at present, minimal intake since admission on 9/17, pt has been requesting food and wanting to eat  I/O: oral intake yesterday day shift was 880 mL, NG output 1900 mL. UOP 300 mL (total of 1000 mL per 24 hours)  Labs: BUN 141, Creatinine 10.82 Meds: NS at 150 ml/hr  Diet Order:  Diet NPO time specified Except for: Ice Chips  Skin:  Reviewed, no issues  Last BM:  9/24 ostomy  Height:   Ht  Readings from Last 1 Encounters:  03/13/17 6' 1"  (1.854 m)    Weight:   Wt Readings from Last 1 Encounters:  03/13/17 143 lb 1.6 oz (64.9 kg)    Ideal Body Weight:  83.6 kg  BMI:  Body mass index is 18.88 kg/m.  Estimated Nutritional Needs:   Kcal:  9532-0233 kcals  Protein:  83-97  Fluid:  >/= 1.7 L  EDUCATION NEEDS:   Education needs no appropriate at this time  [place], [place], LDN [phone] Pager  [phone] Weekend/On-Call Pager

## 2017-03-18 NOTE — Procedures (Signed)
Interventional Radiology Procedure Note  Procedure: Gastrostomy tube placement  Complications: None  Estimated Blood Loss: < 10 mL  14 Fr pigtail gastrostomy placed with tip in upper body of stomach.  Patient did not desire to proceed with nephrostomy tube placements following gastrostomy and wishes to have nephrostomy tubes placed tomorrow.  Venetia Night. Kathlene Cote, M.D Pager:  [phone]

## 2017-03-19 ENCOUNTER — Encounter (HOSPITAL_COMMUNITY): Payer: Self-pay | Admitting: Interventional Radiology

## 2017-03-19 ENCOUNTER — Inpatient Hospital Stay (HOSPITAL_COMMUNITY): Payer: Medicare HMO

## 2017-03-19 HISTORY — PX: IR NEPHROSTOMY PLACEMENT LEFT: IMG6063

## 2017-03-19 HISTORY — PX: IR NEPHROSTOMY PLACEMENT RIGHT: IMG6064

## 2017-03-19 LAB — RENAL FUNCTION PANEL
ALBUMIN: 2 g/dL — AB (ref 3.5–5.0)
ANION GAP: 17 — AB (ref 5–15)
BUN: 141 mg/dL — ABNORMAL HIGH (ref 6–20)
CHLORIDE: 106 mmol/L (ref 101–111)
CO2: 18 mmol/L — ABNORMAL LOW (ref 22–32)
CREATININE: 11.77 mg/dL — AB (ref 0.61–1.24)
Calcium: 8 mg/dL — ABNORMAL LOW (ref 8.9–10.3)
GFR calc Af Amer: 4 mL/min — ABNORMAL LOW (ref >60)
GFR calc non Af Amer: 4 mL/min — ABNORMAL LOW (ref >60)
Glucose, Bld: 65 mg/dL (ref 65–99)
POTASSIUM: 4.5 mmol/L (ref 3.5–5.1)
Phosphorus: 6.8 mg/dL — ABNORMAL HIGH (ref 2.5–4.6)
Sodium: 141 mmol/L (ref 135–145)

## 2017-03-19 LAB — MAGNESIUM: MAGNESIUM: 2.3 mg/dL (ref 1.7–2.4)

## 2017-03-19 MED ORDER — LIDOCAINE HCL 1 % IJ SOLN
INTRAMUSCULAR | Status: AC
  Filled 2017-03-19: qty 20

## 2017-03-19 MED ORDER — LIDOCAINE HCL (PF) 1 % IJ SOLN
INTRAMUSCULAR | Status: AC
  Filled 2017-03-19: qty 20

## 2017-03-19 MED ORDER — IOPAMIDOL (ISOVUE-300) INJECTION 61%
INTRAVENOUS | Status: AC
  Administered 2017-03-19: 25 mL
  Filled 2017-03-19: qty 50

## 2017-03-19 MED ORDER — FENTANYL CITRATE (PF) 100 MCG/2ML IJ SOLN
INTRAMUSCULAR | Status: AC | PRN
  Administered 2017-03-19 (×2): 50 ug via INTRAVENOUS

## 2017-03-19 MED ORDER — FENTANYL CITRATE (PF) 100 MCG/2ML IJ SOLN
INTRAMUSCULAR | Status: AC
  Filled 2017-03-19: qty 4

## 2017-03-19 MED ORDER — DIATRIZOATE MEGLUMINE & SODIUM 66-10 % PO SOLN
90.0000 mL | Freq: Once | ORAL | Status: AC
  Administered 2017-03-19: 90 mL via NASOGASTRIC
  Filled 2017-03-19: qty 90

## 2017-03-19 MED ORDER — IOPAMIDOL (ISOVUE-300) INJECTION 61%
INTRAVENOUS | Status: AC
  Filled 2017-03-19: qty 50

## 2017-03-19 MED ORDER — MIDAZOLAM HCL 2 MG/2ML IJ SOLN
INTRAMUSCULAR | Status: AC
  Filled 2017-03-19: qty 6

## 2017-03-19 MED ORDER — MIDAZOLAM HCL 2 MG/2ML IJ SOLN
INTRAMUSCULAR | Status: AC | PRN
  Administered 2017-03-19 (×2): 1 mg via INTRAVENOUS
  Administered 2017-03-19: 0.5 mg via INTRAVENOUS

## 2017-03-19 MED ORDER — LIDOCAINE HCL (PF) 1 % IJ SOLN
INTRAMUSCULAR | Status: AC | PRN
  Administered 2017-03-19: 10 mL

## 2017-03-19 NOTE — Sedation Documentation (Signed)
Patient is resting comfortably. 

## 2017-03-19 NOTE — Progress Notes (Signed)
Patient wishes to change code status, MD paged

## 2017-03-19 NOTE — Progress Notes (Signed)
Received call from patient's daughter, Kennyth Lose, regarding disposition plan. She informs me that after speaking with home hospice case manager, the family/patient no longer desire home hospice and would like to pursue home health. They are hopeful that patient's kidney function improves to a point that he could resume chemotherapy and feel that home health is a better option for them. She reiterated that the patient's goal is quantity of life over quality and they are willing to pursue routes that give him more time.   No charge note.   Juel Burrow, NP Palliative Medicine [phone]

## 2017-03-19 NOTE — Progress Notes (Addendum)
PROGRESS NOTE    Christopher Potts  AYT:[id number] DOB: [birth year] DOA: 03/10/2017 PCP: [name], MD    Brief Narrative: 71 y.o.malewith medical history significant of carcinoid tumor of the appendix, malignant who was admitted last week at [facility] for "SBO, treated conservatively, still having N/V" as of 9/10, with recommendation to f/u with oncology for progression of disease seen on CT. His family reports that he is here with dehydration. "We think it's because of the immune therapy I've had 3 times. I was dehydrated down to nothing. 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 Started immunotherapy about 6 weeks ago, 1 treatmentevery other week (due Tuesday but he declined). He thinks the loose bowels and n/v with resultant dehydrationstarted right after the immunotherapy. "It tends to dry out everything". Each treatment it got worse. He was hospitaized last week in [place]; they stuck a tube down my throat and pumped out acid and put fluid in my veins. Eventually they took the tube out and were giving liquids." Admitted for Intractable N/V with severe AKI from Dehydration and Hypovolemia. Abdomen and pelvis done yesterday shows distended and fluid-filled distally saphenous, stomach and proximal small bowel indicates he wore for obstruction. Tiny right pleural effusion. Bilateral kidney stones bilateral hydronephrosis. Possible bladder wall thickening. Renal ultrasound showed bilateral hydronephrosis left more than right. CT scan of the abdomen did show bilateral hydronephrosis with increasing tumor burden. I appreciate it urology Dr. Diona Fanti for trying to put a stent in. I have seen the patient in PACU. Patient was started on Neo-Synephrine due to low blood pressure. Patient always had a low blood pressure systolic around 3557.  After prolonged day of discussion with the family wife, daughter I talkedthem in PACU .Patient was awake and alert though his blood pressure was low. He expresses express a desire to die at home. He was concerned about the bills. He was afraid to get hospice on board due to increasing bills. But he was agreeable to DO NOT RESUSCITATE DO NOT INTUBATE. I did speak with the PA with palliative care Stanton Kidney and Stanton Kidney did discuss with patient's wife multiple times. Finally the family and the patient has come to a conclusion to be DO NOT RESUSCITATE and will DC the Neo-Synephrine and send the patient back to the floor and will discuss about hospice tomorrow. NG tube in place.i appreciate all the consultants input and help with this challenging case.patient was due to be discharged today home with hospice.but they want b/l nephrostomy tubes and a venting gastrostomy tube placed for drainage prior to dc. APPRECIATE ALL THE CONSULTANTS INPUT AND HELP.   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 GASTROSTOMY TUBE PLACED 9/25. TODAY TO HAVE BILATERAL NEPHROSTOMY TUBES AND TO GO HOME ON THURSDAY WITH HOSPICE. METASTATIC GLOBAL CELL CARCINOMA APPENDIX SMALL BOWEL OBSTRUCTION PATIENT NOW HAS AN NG TUBE IN PLACE. Re-imag in AM.   DVT prophylaxis:NONE Code Status: do not resuscitate Family Communication: DISCUSSED WITH PATIENT AND WIFE  Disposition Plan IF ALL GOES WELL DISCHARGE THE PATIENT ON Wednesday  HOME WITH HOSPICE   Consultants: NEPHROLOGY, INTERVENTIONAL RADIOLOGY, ONCOLOGY, UROLOGY.  Procedures:GASTROSTOMY TUBE  PLACEMENT AND NEPHROSTOMY TUBE PLACEMENT PENDING.  Antimicrobials NONE  Subjective:  Bleeding at insertion site. Stopped with new dressing and pressure. Doing well.   Objective: Vitals:   03/19/17 1646 03/19/17 1647 03/19/17 1650 03/19/17 1659  BP: (!) 86/67  (!) 89/71   Pulse: (!) 109  100   Resp: 18  16   Temp:      TempSrc:      SpO2: 98% 95% 93% 94%  Weight:      Height:        Intake/Output Summary (Last 24 hours) at 03/19/17 1711 Last data filed at 03/19/17 1600  Gross per 24 hour  Intake             4095 ml  Output             2030 ml  Net             2065 ml   Filed Weights   03/12/17 2016 03/13/17 0834 03/13/17 2215  Weight: 60.8 kg (134 lb) 60.8 kg (134 lb) 64.9 kg (143 lb 1.6 oz)    Examination:  General exam: Appears calm and comfortable; A&Ox3 Respiratory system: Clear to auscultation. Respiratory effort normal. Cardiovascular system: S1 & S2 heard, RRR. No JVD, murmurs, rubs, gallops or clicks. No pedal edema. Gastrointestinal system: Abdomen is nondistended, soft and tender. No organomegaly or masses felt. Normal bowel sounds heard.colostomy in place. Central nervous system: Alert and oriented. No focal neurological deficits. Extremities: Symmetric 5 x 5 power. Skin: No rashes, lesions or ulcers Psychiatry: Judgement and insight appear normal. Mood & affect appropriate.     Data Reviewed: I have personally reviewed following labs and imaging studies  CBC:  Recent Labs Lab 03/13/17 0208  WBC 7.9  NEUTROABS 7.2  HGB 11.4*  HCT 35.6*  MCV 86.2  PLT 294   Basic Metabolic Panel:  Recent Labs Lab 03/13/17 0208 03/17/17 0543 03/17/17 0931 03/18/17 1845 03/19/17 0309  NA 137  --  141 138 141  K 5.2*  --  4.5 4.3 4.5  CL 89*  --  105 104 106  CO2 32  --  18* 18* 18*  GLUCOSE 77  --  65 72 65  BUN 105*  --  141* 145* 141*  CREATININE 7.33* 10.80* 10.82* 12.01* 11.77*  CALCIUM 8.5*  --  8.2* 8.2* 8.0*  MG  --   --   --  2.4 2.3  PHOS   --   --   --  6.7* 6.8*   GFR: Estimated Creatinine Clearance: 5.3 mL/min (A) (by C-G formula based on SCr of 11.77 mg/dL (H)). Liver Function Tests:  Recent Labs Lab 03/18/17 1845 03/19/17 0309  ALBUMIN 2.2* 2.0*   No results for input(s): LIPASE, AMYLASE in the last 168 hours. No results for input(s): AMMONIA in the last 168 hours. Coagulation Profile:  Recent Labs Lab 03/15/17 1428  INR 1.48   Cardiac Enzymes: No results for input(s): CKTOTAL, CKMB, CKMBINDEX, TROPONINI in the last 168 hours. BNP (last 3 results) No results for input(s): PROBNP in the last 8760 hours. HbA1C: No results for input(s): HGBA1C in the last 72 hours. CBG: No results for input(s): GLUCAP in the last 168 hours. Lipid Profile: No results for input(s): CHOL, HDL, LDLCALC, TRIG, CHOLHDL, LDLDIRECT in the last 72 hours. Thyroid Function Tests:  No results for input(s): TSH, T4TOTAL, FREET4, T3FREE, THYROIDAB in the last 72 hours. Anemia Panel: No results for input(s): VITAMINB12, FOLATE, FERRITIN, TIBC, IRON, RETICCTPCT in the last 72 hours. Sepsis Labs: No results for input(s): PROCALCITON, LATICACIDVEN in the last 168 hours.  Recent Results (from the past 240 hour(s))  C difficile quick scan w PCR reflex     Status: None   Collection Time: 03/10/17 11:55 PM  Result Value Ref Range Status   C Diff antigen NEGATIVE NEGATIVE Final   C Diff toxin NEGATIVE NEGATIVE Final   C Diff interpretation No C. difficile detected.  Final  Surgical pcr screen     Status: None   Collection Time: 03/12/17  9:56 PM  Result Value Ref Range Status   MRSA, PCR NEGATIVE NEGATIVE Final   Staphylococcus aureus NEGATIVE NEGATIVE Final    Comment: (NOTE) The Xpert SA Assay (FDA approved for NASAL specimens in patients 105 years of age and older), is one component of a comprehensive surveillance program. It is not intended to diagnose infection nor to guide or monitor treatment.          Radiology  Studies: Ir Gastrostomy Tube Mod Sed  Result Date: 03/19/2017 CLINICAL DATA:  Metastatic malignant appendiceal carcinoid and need for a decompressive gastrostomy tube due to relative gastric outlet obstruction with intractable nausea and vomiting. EXAM: PERCUTANEOUS GASTROSTOMY TUBE PLACEMENT ANESTHESIA/SEDATION: 2.0 mg IV Versed; 50 mcg IV Fentanyl. Total Moderate Sedation Time 18 minutes. The patient's level of consciousness and physiologic status were continuously monitored during the procedure by Radiology nursing. CONTRAST:  10 mL Isovue-300 MEDICATIONS: 2 g IV Ancef. IV antibiotic was administered in an appropriate time interval prior to needle puncture of the skin. FLUOROSCOPY TIME:  1 minutes and 12 seconds.  5.4 mGy. PROCEDURE: The procedure, risks, benefits, and alternatives were explained to the patient. Questions regarding the procedure were encouraged and answered. The patient understands and consents to the procedure. A time-out was performed prior to initiating the procedure. A pre-existing nasogastric tube was used to insufflate the stomach with air under fluoroscopy. Prior to insufflation, the nasogastric tube was connected to suction in order to decompress the stomach. The abdominal wall was prepped with chlorhexidine in a sterile fashion, and a sterile drape was applied covering the operative field. A sterile gown and sterile gloves were used for the procedure. Local anesthesia was provided with 1% Lidocaine. A skin incision was made in the upper abdominal wall. Under fluoroscopy, an 18 gauge trocar needle was advanced into the stomach. Contrast injection was performed to confirm intraluminal position of the needle tip. A single T tack was then deployed in the lumen of the stomach. This was brought up to tension at the skin surface. Over a guidewire, the tract was dilated to 93 [place] and a 14 [place] gastrostomy tube advanced into the stomach. The catheter was injected with contrast  material to confirm position and a fluoroscopic spot image saved. The tube was then flushed with saline. A Prolene retention suture was applied at the catheter exit site. A dressing was applied over the gastrostomy exit site. COMPLICATIONS: None. FINDINGS: The stomach distended well with air allowing safe placement of the gastrostomy tube. After placement, the tip of the gastrostomy tube lies in the proximal body of the stomach. IMPRESSION: Percutaneous gastrostomy with placement of a 14 French tube in the body of the stomach. Electronically Signed   By: [name] M.D.   On: 03/19/2017 09:20  Scheduled Meds: . fentaNYL      . lidocaine      . midazolam      . multivitamin with minerals  1 tablet Oral Daily  . pantoprazole (PROTONIX) IV  40 mg Intravenous Q24H   Continuous Infusions: . sodium chloride 150 mL/hr at 03/18/17 1930     LOS: 9 days      Elwin Mocha, MD [facility]  If 7PM-7AM, please contact night-coverage [email or web address] Password [email or web address] 03/19/2017, 5:11 PM

## 2017-03-19 NOTE — Procedures (Signed)
Pre Procedure Dx: Hydronephrosis Post Procedural Dx: Same  Successful US and fluoroscopic guided placement of bilateral PCNs with ends coiled and locked within the bilateral renal pelvis. Both PCNs connected to gravity bags.  EBL: Minimal  Complications: None immediate.  Jay Braelee Herrle, MD Pager #: [phone]     

## 2017-03-19 NOTE — Sedation Documentation (Signed)
Pt back on bed, sats 94%. Pt responding verbally and asking questions

## 2017-03-20 ENCOUNTER — Telehealth: Payer: Self-pay | Admitting: *Deleted

## 2017-03-20 ENCOUNTER — Inpatient Hospital Stay (HOSPITAL_COMMUNITY): Payer: Medicare HMO

## 2017-03-20 LAB — CBC WITH DIFFERENTIAL/PLATELET
Basophils Absolute: 0 10*3/uL (ref 0.0–0.1)
Basophils Relative: 0 %
Eosinophils Absolute: 0 10*3/uL (ref 0.0–0.7)
Eosinophils Relative: 1 %
HCT: 29.8 % — ABNORMAL LOW (ref 39.0–52.0)
HEMOGLOBIN: 9.7 g/dL — AB (ref 13.0–17.0)
LYMPHS ABS: 0.5 10*3/uL — AB (ref 0.7–4.0)
LYMPHS PCT: 12 %
MCH: 27.6 pg (ref 26.0–34.0)
MCHC: 32.6 g/dL (ref 30.0–36.0)
MCV: 84.7 fL (ref 78.0–100.0)
Monocytes Absolute: 0.1 10*3/uL (ref 0.1–1.0)
Monocytes Relative: 4 %
Neutro Abs: 3.2 10*3/uL (ref 1.7–7.7)
Neutrophils Relative %: 83 %
Platelets: 205 10*3/uL (ref 150–400)
RBC: 3.52 MIL/uL — AB (ref 4.22–5.81)
RDW: 14.8 % (ref 11.5–15.5)
WBC: 3.9 10*3/uL — AB (ref 4.0–10.5)

## 2017-03-20 LAB — RENAL FUNCTION PANEL
ANION GAP: 14 (ref 5–15)
Albumin: 2.2 g/dL — ABNORMAL LOW (ref 3.5–5.0)
BUN: 59 mg/dL — ABNORMAL HIGH (ref 6–20)
CHLORIDE: 117 mmol/L — AB (ref 101–111)
CO2: 18 mmol/L — ABNORMAL LOW (ref 22–32)
Calcium: 8.2 mg/dL — ABNORMAL LOW (ref 8.9–10.3)
Creatinine, Ser: 3.42 mg/dL — ABNORMAL HIGH (ref 0.61–1.24)
GFR calc non Af Amer: 17 mL/min — ABNORMAL LOW (ref >60)
GFR, EST AFRICAN AMERICAN: 19 mL/min — AB (ref >60)
Glucose, Bld: 71 mg/dL (ref 65–99)
Phosphorus: 3.3 mg/dL (ref 2.5–4.6)
Potassium: 4.1 mmol/L (ref 3.5–5.1)
Sodium: 149 mmol/L — ABNORMAL HIGH (ref 135–145)

## 2017-03-20 LAB — MAGNESIUM: MAGNESIUM: 1.9 mg/dL (ref 1.7–2.4)

## 2017-03-20 NOTE — Progress Notes (Signed)
IP PROGRESS NOTE  Subjective:   Christopher Potts underwent placement of a venting gastrostomy tube 03/18/2017 and placement of bilateral nephrostomy tubes yesterday. He has no pain. He would like to have the NG tube removed today.  Christopher Potts has decided against hospice care. He plans to have home nursing care.  Objective: Vital signs in last 24 hours: Blood pressure 90/68, pulse (!) 104, temperature (!) 97.4 F (36.3 C), temperature source Oral, resp. rate 16, height 6\' 1"  (1.854 m), weight 143 lb 1.6 oz (64.9 kg), SpO2 96 %.  Intake/Output from previous day: 09/26 0701 - 09/27 0700 In: 5070 [P.O.:460; I.V.:3675] Out: 1027 [Urine:2725; Emesis/NG output:3800; Stool:30]  Physical Exam:  HEENT:  no thrush Lungs: Clear anteriorly Cardiac: Regular rate and rhythm Abdomen: Firmness in the mid abdomen, left lower quadrant colostomy with a small amount of liquid stool, left upper abdomen gastrostomy tube Musculoskeletal: Bilateral nephrostomy tubes with gauze dressings, blood-tinged urine    Portacath/PICC-without erythema  Lab Results:  BMET  Recent Labs  03/19/17 0309 03/20/17 1131  NA 141 149*  K 4.5 4.1  CL 106 117*  CO2 18* 18*  GLUCOSE 65 71  BUN 141* 59*  CREATININE 11.77* 3.42*  CALCIUM 8.0* 8.2*     Medications: I have reviewed the patient's current medications.  Assessment/Plan:  1. 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 Acute renal failure secondary to obstructive uropathy  Cystoscopy 03/13/2017 confirmed tumor involving the bladder, unable to place ureter stents  Placement of bilateral percutaneous nephrostomy tubes 03/19/2017 3. Small bowel obstruction, Status post placement of a palliative venting gastrostomy tube 03/22/2017 4. Weight loss/failure to thrive secondary to #1  Christopher Potts has undergone  placement of palliative gastrostomy and nephrostomy tubes. He is making a large amount of urine. I suspect he will have a postobstructive diuresis. The creatinine has improved.  Christopher Potts has decided against hospice care. I discussed disposition plans with Christopher Potts and his wife. He declines hospice care. He agrees to follow-up at the [facility]. I will arrange for an outpatient appointment within the next 2 weeks.  Recommendations:  1. Remove NG tube and use the venting gastrostomy for nausea 2. Continue intravenous hydration while hospitalized for management of postobstructive diuresis 3. Outpatient follow-up will be scheduled at the [facility] for within 2 weeks of discharge from the hospital.    LOS: 10 days   Donneta Romberg, MD   03/20/2017, 1:51 PM

## 2017-03-20 NOTE — Progress Notes (Signed)
Referring Physician(s):  Dr. 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  Happy with tubes which have improved back/pelvic pressure. States he anticipates d/c home tomorrow.  Allergies: Fish allergy and Strawberry extract  Medications: Prior to Admission medications   Medication Sig Start Date End Date Taking? Authorizing Provider  MAGNESIUM PO Take 1 tablet by mouth daily.   Yes [provider]  Multiple Vitamin (MULITIVITAMIN WITH MINERALS) TABS Take 1 tablet by mouth daily.   Yes [provider]  potassium chloride (K-DUR,KLOR-CON) 10 MEQ tablet Take 10 mEq by mouth 2 (two) times daily.   Yes [provider]  ranitidine (ZANTAC) 75 MG tablet Take 75 mg by mouth daily as needed. For acid reflux   Yes [provider]     Vital Signs: BP 90/68 (BP Location: Left Arm)   Pulse (!) 104   Temp (!) 97.4 F (36.3 C) (Oral)   Resp 16   Ht 6\' 1"  (1.854 m)   Wt 143 lb 1.6 oz (64.9 kg)   SpO2 96%   BMI 18.88 kg/m   Physical Exam  NAD, alert Abd:  Gastrostomy in place, clamped.  Dried blood present around puncture site, but appears stable now. No new blood or bruising, no hematoma.  Bilateral nephrostomy tubes in place. Insertion sites clean and dry.  Bloody urine draining from tubes.   Imaging: Ir Gastrostomy Tube Mod Sed  Result Date: 03/19/2017 CLINICAL DATA:  Metastatic malignant appendiceal carcinoid and need for a decompressive gastrostomy tube due to relative gastric outlet obstruction with intractable nausea and vomiting. EXAM: PERCUTANEOUS GASTROSTOMY TUBE PLACEMENT ANESTHESIA/SEDATION: 2.0 mg IV Versed; 50 mcg IV Fentanyl. Total Moderate Sedation Time 18 minutes. The patient's level of consciousness and physiologic status were continuously monitored during the  procedure by Radiology nursing. CONTRAST:  10 mL Isovue-300 MEDICATIONS: 2 g IV Ancef. IV antibiotic was administered in an appropriate time interval prior to needle puncture of the skin. FLUOROSCOPY TIME:  1 minutes and 12 seconds.  5.4 mGy. PROCEDURE: The procedure, risks, benefits, and alternatives were explained to the patient. Questions regarding the procedure were encouraged and answered. The patient understands and consents to the procedure. A time-out was performed prior to initiating the procedure. A pre-existing nasogastric tube was used to insufflate the stomach with air under fluoroscopy. Prior to insufflation, the nasogastric tube was connected to suction in order to decompress the stomach. The abdominal wall was prepped with chlorhexidine in a sterile fashion, and a sterile drape was applied covering the operative field. A sterile gown and sterile gloves were used for the procedure. Local anesthesia was provided with 1% Lidocaine. A skin incision was made in the upper abdominal wall. Under fluoroscopy, an 18 gauge trocar needle was advanced into the stomach. Contrast injection was performed to confirm intraluminal position of the needle tip. A single T tack was then deployed in the lumen of the stomach. This was brought up to tension at the skin surface. Over a guidewire, the tract was dilated to 48 [place] and a 14 [place] gastrostomy tube advanced into the stomach. The catheter was injected with contrast material to confirm position and a fluoroscopic spot image saved. The tube was then flushed with saline. A Prolene retention suture was applied at the catheter exit site. A dressing was applied over the gastrostomy exit site. COMPLICATIONS:  None. FINDINGS: The stomach distended well with air allowing safe placement of the gastrostomy tube. After placement, the tip of the gastrostomy tube lies in the proximal body of the stomach. IMPRESSION: Percutaneous gastrostomy with placement of a 14  French tube in the body of the stomach. Electronically Signed   By: [name] M.D.   On: 03/19/2017 09:20   Dg Abd Portable 1v-small Bowel Obstruction Protocol-initial, 8 Hr Delay  Result Date: 03/20/2017 CLINICAL DATA:  Small-bowel obstruction, followup EXAM: PORTABLE ABDOMEN - 1 VIEW COMPARISON:  CT abdomen pelvis of 03/12/2017 . FINDINGS: bilateral nephrostomy tubes are present. A pigtail catheter overlies the left upper quadrant. NG tube tip overlies the expected mid body of the stomach. Considerable improvement and gaseous distention of small bowel is present IMPRESSION: 1. Improvement in degree of small bowel distension. 2. Bilateral nephrostomy tubes are present. Electronically Signed   By: Ivar Drape M.D.   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 Patient now with malignant bilateral hydronephrosis and request made for placement of bilateral percutaneous nephrostomy catheter for urinary diversion and palliative purposes. EXAM: 1. ULTRASOUND GUIDANCE FOR PUNCTURE OF THE BILATERAL RENAL COLLECTING SYSTEM 2. BILATERAL PERCUTANEOUS NEPHROSTOMY TUBE PLACEMENT. COMPARISON:  CT abdomen pelvis - 03/12/2017 MEDICATIONS: Patient is currently admitted to the hospital and receiving intravenous antibiotics; The antibiotic was administered in an appropriate time frame prior to skin puncture. ANESTHESIA/SEDATION: Moderate (conscious) sedation was employed during this procedure. A total of Versed 2.5 mg and Fentanyl 100 mcg was administered intravenously. Moderate Sedation Time: 15 minutes. The patient's level of consciousness and vital signs were monitored continuously by radiology nursing throughout the procedure under my direct supervision. CONTRAST:  A total of approximately 25 cc of Isovue 300 was administered into the bilateral collecting  systems FLUOROSCOPY TIME:  1 minute 45 seconds (4 mGy) COMPLICATIONS: None immediate. PROCEDURE: The procedure, risks, benefits, and alternatives were explained to the patient. Questions regarding the procedure were encouraged and answered. The patient understands and consents to the procedure. A timeout was performed prior to the initiation of the procedure. The bilateral flanks were prepped with Betadine in a sterile fashion, and a sterile drape was applied covering the operative field. A sterile gown and sterile gloves were used for the procedure. Local anesthesia was provided with 1% Lidocaine with epinephrine. Ultrasound was used to localize the left kidney. Under direct ultrasound guidance, a 21 gauge needle was advanced into the renal collecting system. An ultrasound image documentation was performed. Access within the collecting system was confirmed with the efflux of urine followed by contrast injection. As such, the tract was dilated with an Accustick stent. Over a short Amplatz wire, a 10-French percutaneous nephrostomy catheter was advanced into the collecting system where the coil was formed and locked. Contrast was injected and several spot fluoroscopic were obtained in various obliquities confirming access. The catheter was secured at the skin with a Prolene retention suture and a gravity bag was placed. The identical procedure was repeated for the contralateral right kidney ultimately allowing placement of a 10 French percutaneous drainage catheter via a posterior inferior calyx with and coiled and locked within the right renal pelvis. Contrast injection confirmed appropriate positioning. Several spot fluoroscopic images were obtained in various obliquities confirming appropriate access. The catheter was secured at the skin with a Prolene retention suture in an gravity bag was placed.  Dressings were placed. The patient tolerated both procedures well without immediate postprocedural complication.  FINDINGS: Ultrasound scanning demonstrates a moderate to severely dilated bilateral renal collecting systems. Under direct ultrasound guidance, posterior inferior calices were targeted bilaterally allowing advancement of bilateral 10-French percutaneous nephrostomy catheters under intermittent fluoroscopic guidance. Contrast injection confirmed appropriate positioning. IMPRESSION: Successful ultrasound and fluoroscopic guided placement of bilateral 10 French PCNs. Electronically Signed   By: Sandi Mariscal M.D.   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 Patient now with malignant bilateral hydronephrosis and request made for placement of bilateral percutaneous nephrostomy catheter for urinary diversion and palliative purposes. EXAM: 1. ULTRASOUND GUIDANCE FOR PUNCTURE OF THE BILATERAL RENAL COLLECTING SYSTEM 2. BILATERAL PERCUTANEOUS NEPHROSTOMY TUBE PLACEMENT. COMPARISON:  CT abdomen pelvis - 03/12/2017 MEDICATIONS: Patient is currently admitted to the hospital and receiving intravenous antibiotics; The antibiotic was administered in an appropriate time frame prior to skin puncture. ANESTHESIA/SEDATION: Moderate (conscious) sedation was employed during this procedure. A total of Versed 2.5 mg and Fentanyl 100 mcg was administered intravenously. Moderate Sedation Time: 15 minutes. The patient's level of consciousness and vital signs were monitored continuously by radiology nursing throughout the procedure under my direct supervision. CONTRAST:  A total of approximately 25 cc of Isovue 300 was administered into the bilateral collecting systems FLUOROSCOPY TIME:  1 minute 45 seconds (4 mGy) COMPLICATIONS: None immediate. PROCEDURE: The procedure, risks, benefits, and alternatives were explained to the patient. Questions regarding the procedure were  encouraged and answered. The patient understands and consents to the procedure. A timeout was performed prior to the initiation of the procedure. The bilateral flanks were prepped with Betadine in a sterile fashion, and a sterile drape was applied covering the operative field. A sterile gown and sterile gloves were used for the procedure. Local anesthesia was provided with 1% Lidocaine with epinephrine. Ultrasound was used to localize the left kidney. Under direct ultrasound guidance, a 21 gauge needle was advanced into the renal collecting system. An ultrasound image documentation was performed. Access within the collecting system was confirmed with the efflux of urine followed by contrast injection. As such, the tract was dilated with an Accustick stent. Over a short Amplatz wire, a 10-French percutaneous nephrostomy catheter was advanced into the collecting system where the coil was formed and locked. Contrast was injected and several spot fluoroscopic were obtained in various obliquities confirming access. The catheter was secured at the skin with a Prolene retention suture and a gravity bag was placed. The identical procedure was repeated for the contralateral right kidney ultimately allowing placement of a 10 French percutaneous drainage catheter via a posterior inferior calyx with and coiled and locked within the right renal pelvis. Contrast injection confirmed appropriate positioning. Several spot fluoroscopic images were obtained in various obliquities confirming appropriate access. The catheter was secured at the skin with a Prolene retention suture in an gravity bag was placed. Dressings were placed. The patient tolerated both procedures well without immediate postprocedural complication. FINDINGS: Ultrasound scanning demonstrates a moderate to severely dilated bilateral renal collecting systems. Under direct ultrasound guidance, posterior inferior calices were targeted bilaterally allowing advancement of  bilateral 10-French percutaneous nephrostomy catheters under intermittent fluoroscopic guidance. Contrast injection confirmed appropriate positioning. IMPRESSION: Successful ultrasound and fluoroscopic guided placement of bilateral 10 French PCNs. Electronically Signed   By: Sandi Mariscal M.D.   On: 03/19/2017 17:24  Labs:  CBC:  Recent Labs  03/11/17 0713 03/12/17 0517 03/13/17 0208 03/20/17 0847  WBC 7.5 5.5 7.9 3.9*  HGB 10.7* 12.1* 11.4* 9.7*  HCT 31.6* 36.6* 35.6* 29.8*  PLT 169 166 196 205    COAGS:  Recent Labs  03/15/17 1428  INR 1.48    BMP:  Recent Labs  03/13/17 0208 03/17/17 0543 03/17/17 0931 03/18/17 1845 03/19/17 0309  NA 137  --  141 138 141  K 5.2*  --  4.5 4.3 4.5  CL 89*  --  105 104 106  CO2 32  --  18* 18* 18*  GLUCOSE 77  --  65 72 65  BUN 105*  --  141* 145* 141*  CALCIUM 8.5*  --  8.2* 8.2* 8.0*  CREATININE 7.33* 10.80* 10.82* 12.01* 11.77*  GFRNONAA 7* 4* 4* 4* 4*  GFRAA 8* 5* 5* 4* 4*    LIVER FUNCTION TESTS:  Recent Labs  03/12/17 0517 03/18/17 1845 03/19/17 0309  BILITOT 1.6*  --   --   AST 40  --   --   ALT 20  --   --   ALKPHOS 51  --   --   PROT 5.4*  --   --   ALBUMIN 2.8* 2.2* 2.0*    Assessment and Plan: 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 Kathlene Cote, and bilateral nephrostomy tubes placed 9/26 by Dr. Pascal Lux.  Patient more comfortable today.   Some bleeding from G-tube insertion site initially, but none today. G-tube ok to use. Patient hoping to have NGT removed today. Answered questions related to tube management.   His current plan is to home with home health. IR to follow.   Electronically Signed: Docia Barrier, PA 03/20/2017, 11:33 AM   I spent a total of 15 Minutes at the the patient's bedside AND on the patient's hospital floor or unit, greater than 50% of which was counseling/coordinating care for Metastatic goblet  cell carcinoid-adenocarcinoma of the appendix

## 2017-03-20 NOTE — Progress Notes (Signed)
PROGRESS NOTE    Christopher Potts  NAT:[id number] DOB: [birth year] DOA: 03/10/2017 PCP: [name], MD    Brief Narrative: 71 y.o.malewith medical history significant of carcinoid tumor of the appendix, malignant who was admitted last week at [facility] for "SBO, treated conservatively, still having N/V" as of 9/10, with recommendation to f/u with oncology for progression of disease seen on CT. His family reports that he is here with dehydration. "We think it's because of the immune therapy I've had 3 times. I was dehydrated down to nothing. 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 Started immunotherapy about 6 weeks ago, 1 treatmentevery other week (due Tuesday but he declined). He thinks the loose bowels and n/v with resultant dehydrationstarted right after the immunotherapy. "It tends to dry out everything". Each treatment it got worse. He was hospitaized last week in [place]; they stuck a tube down my throat and pumped out acid and put fluid in my veins. Eventually they took the tube out and were giving liquids." Admitted for Intractable N/V with severe AKI from Dehydration and Hypovolemia. Abdomen and pelvis done yesterday shows distended and fluid-filled distally saphenous, stomach and proximal small bowel indicates he wore for obstruction. Tiny right pleural effusion. Bilateral kidney stones bilateral hydronephrosis. Possible bladder wall thickening. Renal ultrasound showed bilateral hydronephrosis left more than right. CT scan of the abdomen did show bilateral hydronephrosis with increasing tumor burden. I appreciate it urology Dr. Diona Fanti for trying to put a stent in. I have seen the patient in PACU. Patient was started on Neo-Synephrine due to low blood pressure. Patient always had a low blood pressure systolic around 4270.  After prolonged day of discussion with the family wife, daughter I talkedthem in PACU .Patient was awake and alert though his blood pressure was low. He expresses express a desire to die at home. He was concerned about the bills. He was afraid to get hospice on board due to increasing bills. But he was agreeable to DO NOT RESUSCITATE DO NOT INTUBATE. I did speak with the PA with palliative care [facility] Kidney and [facility]ney did discuss with patient's wife multiple times. Patient has bilateral nephrostomies and gastrostomy tube in place. Patient will be discharged home to hospice. APPRECIATE ALL THE CONSULTANTS INPUT AND HELP.   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 GASTROSTOMY TUBE PLACED 9/25. TODAY TO HAVE BILATERAL NEPHROSTOMY TUBES (9/26) AND TO GO HOME ON FRIDAY WITH HOSPICE. Creatinine has improved. METASTATIC GLOBAL CELL CARCINOMA APPENDIX SMALL BOWEL OBSTRUCTION PATIENT NOW HAS AN NG TUBE removed.Currently given trial of by mouth. Tube clamped. Small bowel obstruction protocol films show less small bowel distention than previous.   DVT prophylaxis:NONE Code Status: FULL CODE Family Communication: DISCUSSED WITH PATIENT AND WIFE Disposition Plan IF ALL GOES WELL DISCHARGE THE PATIENT ON Friday  HOME WITH HOSPICE   Consultants: NEPHROLOGY, INTERVENTIONAL RADIOLOGY, ONCOLOGY, UROLOGY.  Procedures:GASTROSTOMY TUBE PLACEMENT AND NEPHROSTOMY TUBE PLACEMENT PENDING.  Antimicrobials NONE  Subjective:  Doing well. Requested  to change CODE STATUS. Discharge home with hospice.   Objective: Vitals:    03/19/17 1659 03/19/17 2159 03/20/17 0637 03/20/17 1437  BP:  (!) 89/63 90/68 94/66   Pulse:  100 (!) 104 (!) 102  Resp:  16 16 18   Temp:  97.7 F (36.5 C) (!) 97.4 F (36.3 C) 98 F (36.7 C)  TempSrc:  Oral Oral Oral  SpO2: 94% 97% 96% 98%  Weight:      Height:        Intake/Output Summary (Last 24 hours) at 03/20/17 1516 Last data filed at 03/20/17 1300  Gross per 24 hour  Intake           7547.5 ml  Output             9125 ml  Net          -1577.5 ml   Filed Weights   03/12/17 2016 03/13/17 0834 03/13/17 2215  Weight: 60.8 kg (134 lb) 60.8 kg (134 lb) 64.9 kg (143 lb 1.6 oz)    Examination:  General exam: Appears calm and comfortable; A&Ox3 Respiratory system: Clear to auscultation. Respiratory effort normal. Cardiovascular system: S1 & S2 heard, RRR. No JVD, murmurs, rubs, gallops or clicks. No pedal edema. Gastrointestinal system: Abdomen is nondistended, soft and tender. No organomegaly or masses felt. Normal bowel sounds heard.colostomy in place. Central nervous system: Alert and oriented. No focal neurological deficits. Extremities: Symmetric 5 x 5 power. Skin: No rashes, lesions or ulcers Psychiatry: Judgement and insight appear normal. Mood & affect appropriate.  Lines: Bilateral nephrostomy tubes, gastrostomy tube.    Data Reviewed: I have personally reviewed following labs and imaging studies  CBC:  Recent Labs Lab 03/20/17 0847  WBC 3.9*  NEUTROABS 3.2  HGB 9.7*  HCT 29.8*  MCV 84.7  PLT 458   Basic Metabolic Panel:  Recent Labs Lab 03/17/17 0543 03/17/17 0931 03/18/17 1845 03/19/17 0309 03/20/17 0847 03/20/17 1131  NA  --  141 138 141  --  149*  K  --  4.5 4.3 4.5  --  4.1  CL  --  105 104 106  --  117*  CO2  --  18* 18* 18*  --  18*  GLUCOSE  --  65 72 65  --  71  BUN  --  141* 145* 141*  --  59*  CREATININE 10.80* 10.82* 12.01* 11.77*  --  3.42*  CALCIUM  --  8.2* 8.2* 8.0*  --  8.2*  MG  --   --  2.4 2.3 1.9  --   PHOS  --   --   6.7* 6.8*  --  3.3   GFR: Estimated Creatinine Clearance: 18.2 mL/min (A) (by C-G formula based on SCr of 3.42 mg/dL (H)). Liver Function Tests:  Recent Labs Lab 03/18/17 1845 03/19/17 0309 03/20/17 1131  ALBUMIN 2.2* 2.0* 2.2*   No results for input(s): LIPASE, AMYLASE in the last 168 hours. No results for input(s): AMMONIA in the last 168 hours. Coagulation Profile:  Recent Labs Lab 03/15/17 1428  INR 1.48   Cardiac Enzymes: No results for input(s): CKTOTAL, CKMB, CKMBINDEX, TROPONINI in the last 168 hours. BNP (last 3 results) No results for input(s): PROBNP in the last 8760 hours. HbA1C: No results for input(s): HGBA1C in the last 72 hours. CBG: No results for input(s): GLUCAP in the last 168 hours. Lipid Profile: No results for input(s): CHOL, HDL, LDLCALC, TRIG, CHOLHDL, LDLDIRECT in the last 72 hours. Thyroid Function Tests:  No results for input(s): TSH, T4TOTAL, FREET4, T3FREE, THYROIDAB in the last 72 hours. Anemia Panel: No results for input(s): VITAMINB12, FOLATE, FERRITIN, TIBC, IRON, RETICCTPCT in the last 72 hours. Sepsis Labs: No results for input(s): PROCALCITON, LATICACIDVEN in the last 168 hours.  Recent Results (from the past 240 hour(s))  C difficile quick scan w PCR reflex     Status: None   Collection Time: 03/10/17 11:55 PM  Result Value Ref Range Status   C Diff antigen NEGATIVE NEGATIVE Final   C Diff toxin NEGATIVE NEGATIVE Final   C Diff interpretation No C. difficile detected.  Final  Surgical pcr screen     Status: None   Collection Time: 03/12/17  9:56 PM  Result Value Ref Range Status   MRSA, PCR NEGATIVE NEGATIVE Final   Staphylococcus aureus NEGATIVE NEGATIVE Final    Comment: (NOTE) The Xpert SA Assay (FDA approved for NASAL specimens in patients 38 years of age and older), is one component of a comprehensive surveillance program. It is not intended to diagnose infection nor to guide or monitor treatment.           Radiology Studies: Ir Gastrostomy Tube Mod Sed  Result Date: 03/19/2017 CLINICAL DATA:  Metastatic malignant appendiceal carcinoid and need for a decompressive gastrostomy tube due to relative gastric outlet obstruction with intractable nausea and vomiting. EXAM: PERCUTANEOUS GASTROSTOMY TUBE PLACEMENT ANESTHESIA/SEDATION: 2.0 mg IV Versed; 50 mcg IV Fentanyl. Total Moderate Sedation Time 18 minutes. The patient's level of consciousness and physiologic status were continuously monitored during the procedure by Radiology nursing. CONTRAST:  10 mL Isovue-300 MEDICATIONS: 2 g IV Ancef. IV antibiotic was administered in an appropriate time interval prior to needle puncture of the skin. FLUOROSCOPY TIME:  1 minutes and 12 seconds.  5.4 mGy. PROCEDURE: The procedure, risks, benefits, and alternatives were explained to the patient. Questions regarding the procedure were encouraged and answered. The patient understands and consents to the procedure. A time-out was performed prior to initiating the procedure. A pre-existing nasogastric tube was used to insufflate the stomach with air under fluoroscopy. Prior to insufflation, the nasogastric tube was connected to suction in order to decompress the stomach. The abdominal wall was prepped with chlorhexidine in a sterile fashion, and a sterile drape was applied covering the operative field. A sterile gown and sterile gloves were used for the procedure. Local anesthesia was provided with 1% Lidocaine. A skin incision was made in the upper abdominal wall. Under fluoroscopy, an 18 gauge trocar needle was advanced into the stomach. Contrast injection was performed to confirm intraluminal position of the needle tip. A single T tack was then deployed in the lumen of the stomach. This was brought up to tension at the skin surface. Over a guidewire, the tract was dilated to 25 [place] and a 14 [place] Malinckrodt gastrostomy tube advanced into the stomach. The catheter was  injected with contrast material to confirm position and a fluoroscopic spot image saved. The tube was then flushed with saline. A Prolene retention suture was applied at the catheter exit site. A dressing was applied over the gastrostomy exit site. COMPLICATIONS: None. FINDINGS: The stomach distended well with air allowing safe placement of the gastrostomy tube. After placement, the tip of the gastrostomy tube lies in the proximal body of the stomach. IMPRESSION: Percutaneous gastrostomy with placement of a 14 French tube in the body of the stomach. Electronically Signed   By: [name] M.D.   On: 03/19/2017 09:20   Dg  Abd Portable 1v-small Bowel Obstruction Protocol-initial, 8 Hr Delay  Result Date: 03/20/2017 CLINICAL DATA:  Small-bowel obstruction, followup EXAM: PORTABLE ABDOMEN - 1 VIEW COMPARISON:  CT abdomen pelvis of 03/12/2017 . FINDINGS: bilateral nephrostomy tubes are present. A pigtail catheter overlies the left upper quadrant. NG tube tip overlies the expected mid body of the stomach. Considerable improvement and gaseous distention of small bowel is present IMPRESSION: 1. Improvement in degree of small bowel distension. 2. Bilateral nephrostomy tubes are present. Electronically Signed   By: Ivar Drape M.D.   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 Patient now with malignant bilateral hydronephrosis and request made for placement of bilateral percutaneous nephrostomy catheter for urinary diversion and palliative purposes. EXAM: 1. ULTRASOUND GUIDANCE FOR PUNCTURE OF THE BILATERAL RENAL COLLECTING SYSTEM 2. BILATERAL PERCUTANEOUS NEPHROSTOMY TUBE PLACEMENT. COMPARISON:  CT abdomen pelvis - 03/12/2017 MEDICATIONS: Patient is currently admitted to the hospital and receiving intravenous antibiotics; The antibiotic was administered  in an appropriate time frame prior to skin puncture. ANESTHESIA/SEDATION: Moderate (conscious) sedation was employed during this procedure. A total of Versed 2.5 mg and Fentanyl 100 mcg was administered intravenously. Moderate Sedation Time: 15 minutes. The patient's level of consciousness and vital signs were monitored continuously by radiology nursing throughout the procedure under my direct supervision. CONTRAST:  A total of approximately 25 cc of Isovue 300 was administered into the bilateral collecting systems FLUOROSCOPY TIME:  1 minute 45 seconds (4 mGy) COMPLICATIONS: None immediate. PROCEDURE: The procedure, risks, benefits, and alternatives were explained to the patient. Questions regarding the procedure were encouraged and answered. The patient understands and consents to the procedure. A timeout was performed prior to the initiation of the procedure. The bilateral flanks were prepped with Betadine in a sterile fashion, and a sterile drape was applied covering the operative field. A sterile gown and sterile gloves were used for the procedure. Local anesthesia was provided with 1% Lidocaine with epinephrine. Ultrasound was used to localize the left kidney. Under direct ultrasound guidance, a 21 gauge needle was advanced into the renal collecting system. An ultrasound image documentation was performed. Access within the collecting system was confirmed with the efflux of urine followed by contrast injection. As such, the tract was dilated with an Accustick stent. Over a short Amplatz wire, a 10-French percutaneous nephrostomy catheter was advanced into the collecting system where the coil was formed and locked. Contrast was injected and several spot fluoroscopic were obtained in various obliquities confirming access. The catheter was secured at the skin with a Prolene retention suture and a gravity bag was placed. The identical procedure was repeated for the contralateral right kidney ultimately allowing  placement of a 10 French percutaneous drainage catheter via a posterior inferior calyx with and coiled and locked within the right renal pelvis. Contrast injection confirmed appropriate positioning. Several spot fluoroscopic images were obtained in various obliquities confirming appropriate access. The catheter was secured at the skin with a Prolene retention suture in an gravity bag was placed. Dressings were placed. The patient tolerated both procedures well without immediate postprocedural complication. FINDINGS: Ultrasound scanning demonstrates a moderate to severely dilated bilateral renal collecting systems. Under direct ultrasound guidance, posterior inferior calices were targeted bilaterally allowing advancement of bilateral 10-French percutaneous nephrostomy catheters under intermittent fluoroscopic guidance. Contrast injection confirmed appropriate positioning. IMPRESSION: Successful ultrasound and fluoroscopic guided placement of bilateral 10 [place]  PCNs. Electronically Signed   By: Sandi Mariscal M.D.   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 Patient now with malignant bilateral hydronephrosis and request made for placement of bilateral percutaneous nephrostomy catheter for urinary diversion and palliative purposes. EXAM: 1. ULTRASOUND GUIDANCE FOR PUNCTURE OF THE BILATERAL RENAL COLLECTING SYSTEM 2. BILATERAL PERCUTANEOUS NEPHROSTOMY TUBE PLACEMENT. COMPARISON:  CT abdomen pelvis - 03/12/2017 MEDICATIONS: Patient is currently admitted to the hospital and receiving intravenous antibiotics; The antibiotic was administered in an appropriate time frame prior to skin puncture. ANESTHESIA/SEDATION: Moderate (conscious) sedation was employed during this procedure. A total of Versed 2.5 mg and Fentanyl 100 mcg was administered intravenously.  Moderate Sedation Time: 15 minutes. The patient's level of consciousness and vital signs were monitored continuously by radiology nursing throughout the procedure under my direct supervision. CONTRAST:  A total of approximately 25 cc of Isovue 300 was administered into the bilateral collecting systems FLUOROSCOPY TIME:  1 minute 45 seconds (4 mGy) COMPLICATIONS: None immediate. PROCEDURE: The procedure, risks, benefits, and alternatives were explained to the patient. Questions regarding the procedure were encouraged and answered. The patient understands and consents to the procedure. A timeout was performed prior to the initiation of the procedure. The bilateral flanks were prepped with Betadine in a sterile fashion, and a sterile drape was applied covering the operative field. A sterile gown and sterile gloves were used for the procedure. Local anesthesia was provided with 1% Lidocaine with epinephrine. Ultrasound was used to localize the left kidney. Under direct ultrasound guidance, a 21 gauge needle was advanced into the renal collecting system. An ultrasound image documentation was performed. Access within the collecting system was confirmed with the efflux of urine followed by contrast injection. As such, the tract was dilated with an Accustick stent. Over a short Amplatz wire, a 10-French percutaneous nephrostomy catheter was advanced into the collecting system where the coil was formed and locked. Contrast was injected and several spot fluoroscopic were obtained in various obliquities confirming access. The catheter was secured at the skin with a Prolene retention suture and a gravity bag was placed. The identical procedure was repeated for the contralateral right kidney ultimately allowing placement of a 10 French percutaneous drainage catheter via a posterior inferior calyx with and coiled and locked within the right renal pelvis. Contrast injection confirmed appropriate positioning. Several spot  fluoroscopic images were obtained in various obliquities confirming appropriate access. The catheter was secured at the skin with a Prolene retention suture in an gravity bag was placed. Dressings were placed. The patient tolerated both procedures well without immediate postprocedural complication. FINDINGS: Ultrasound scanning demonstrates a moderate to severely dilated bilateral renal collecting systems. Under direct ultrasound guidance, posterior inferior calices were targeted bilaterally allowing advancement of bilateral 10-French percutaneous nephrostomy catheters under intermittent fluoroscopic guidance. Contrast injection confirmed appropriate positioning. IMPRESSION: Successful ultrasound and fluoroscopic guided placement of bilateral 10 French PCNs. Electronically Signed   By: Sandi Mariscal M.D.   On: 03/19/2017 17:24        Scheduled Meds: . multivitamin with minerals  1 tablet Oral Daily  . pantoprazole (PROTONIX) IV  40 mg Intravenous Q24H   Continuous Infusions: . sodium chloride 150 mL/hr (03/20/17 1251)     LOS: 10 days      Elwin Mocha, MD [facility]  If 7PM-7AM, please contact night-coverage [email or web address] Password [facility] 03/20/2017,  3:16 PM

## 2017-03-20 NOTE — Telephone Encounter (Signed)
Message from pt's daughter reporting "problems at [facility]." Requesting return call.  Called daughter, she reports the issue has been handled. Informed her we will schedule appt with Dr. Benay Spice in about 2 weeks. She voiced appreciation for return call.

## 2017-03-20 NOTE — Care Management Note (Addendum)
Case Management Note  Patient Details  Name: WINSLOW EDERER MRN: [id number] Date of Birth: [birth year]  Subjective/Objective:      CM following for progression and d/c planning.               Action/Plan: 03/20/2017 Upon final review of the pt record, this CM noted a note by [facility] indicating that thie pt no longer wishes to home hospice services per a call received by the [place] NP from the pt daughter.  This CM unable to reach this NP or to get an answer at the [facility]. Call placed to pt daughter who validated that they no longer wished to use hospice services and assured this CM that Edgar was arranging [facility] services, she also stated that she had spoken with the Hospice CM for [facility] services re d/c their services.  This CM contacted Lattie Haw of [facility] and confirmed that they were aware of the plan by the family to pursue [facility] services and perhaps continue chemo for this pt. Per pt daughter the pt and his wife would be able to tell this CM their last [facility] provider, so that CM could arrange [facility] services. As they are unable to recall the last provider, this CM is currently attempting to contact both [facility] [place] and [facility] to attempt to determine which agency provided care. I am unable to pull from pt record as his most recent hospitalization were at [facility] in [place].  As the family wishes to use their most recent provider, I am currently awaiting responses from [facility] and [facility] re their dates of services.  12:45 pm [facility] was pt most recent provider, having provided services as recently as April 2018. The pt and his wife agree that they would like to resume services with [facility]. [place] notified. Will continue to follow, pt plans to d/c to home with his son in law via motor vehicle.  Pt and wife declined ambulance transportation.   Expected Discharge Date:   03/21/2017               Expected Discharge Plan:  [place]  In-House Referral:  Clinical Social Work  Discharge planning Services  CM Consult  Post Acute Care Choice:  Home Health Choice offered to:  Spouse, Patient, Adult Children  DME Arranged:  N/A DME Agency:  NA  HH Arranged:  RN, Nurse's Aide, Social Work, PT, OT HH Agency:   ( )[facility]  Status of Service:  In process, will continue to follow  If discussed at Long Length of Stay Meetings, dates discussed:    Additional Comments:  Adron Bene, RN 03/20/2017, 12:20 PM

## 2017-03-20 NOTE — Telephone Encounter (Signed)
Opened chart in error.

## 2017-03-20 NOTE — Progress Notes (Signed)
Palliative Medicine RN Note: Rec'd urgent voicemails from patient reporting a problem he needs help with immediately. RN reports that pt has been very anxious about the tubing on his venting PEG, and RNCM reports that the plan is d/c tomorrow w HH through [facility].   I went to see the patient who expressed significant distress about being hooked back up to suction. He was distressed about the kind of tubing used and the stopcock/Lopez valve being placed "backwards." He is unhappy with needing tape to hold the valve to the tubing that comes from his skin.   I carefully removed the tubing from the non-patient/suction end of the valve to demonstrate that it is indeed positioned correctly, which relieved some distress. We discussed his being hooked up to suction and his fear that his stomach will be pulled by the suction device if his belly is emptied; he was not comfortable until the RN told him that his wife could turn off the suction overnight if needed. He reports that he may have to have suction at home and that is not acceptable to him to be continuously connected, as he still wants "to get out" of the house; he is also concerned because he does not have a suction machine at home and reasonably does not want to go home unless he has one in case he needs it.  Plan as follows:  1. Home suction: I have left a message for RN CM [place], who is working on his d/c needs to make her aware of need for suction to be delivered before d/c. 2. I spoke with [name], who placed the venting PEG. He will go see the patient and his wife tomorrow to review the tubing and ensure them that he does have the appropriate tubing and connectors. 3. Spoke with Dr Rhea Pink, medical director for [facility]. The [facility] NP who has been seeing Mr Nasworthy is out until Monday. I have concerns that Mr Hamberger does not understand what life will look like with all these tubes and with a venting PEG. Dr Hilma Favors will go see him in the  afternoon (her earliest availability) to be sure his questions and concerns, as well as expectations, have been addressed.  4. General: I will follow up with him before lunch to let him know where we stand on getting all these people lined up.  Marjie Skiff Ilyanna Baillargeon, RN, BSN, [facility] 03/20/2017 4:55 PM Cell [phone] 8:00-4:00 Monday-Friday Office [phone]

## 2017-03-21 ENCOUNTER — Telehealth: Payer: Self-pay | Admitting: Oncology

## 2017-03-21 DIAGNOSIS — N1330 Unspecified hydronephrosis: Secondary | ICD-10-CM

## 2017-03-21 LAB — BASIC METABOLIC PANEL
Anion gap: 9 (ref 5–15)
Anion gap: 7 (ref 5–15)
BUN: 32 mg/dL — AB (ref 6–20)
BUN: 20 mg/dL (ref 6–20)
CALCIUM: 8 mg/dL — AB (ref 8.9–10.3)
CHLORIDE: 123 mmol/L — AB (ref 101–111)
CO2: 18 mmol/L — ABNORMAL LOW (ref 22–32)
CO2: 18 mmol/L — ABNORMAL LOW (ref 22–32)
Calcium: 8 mg/dL — ABNORMAL LOW (ref 8.9–10.3)
Chloride: 123 mmol/L — ABNORMAL HIGH (ref 101–111)
Creatinine, Ser: 1.64 mg/dL — ABNORMAL HIGH (ref 0.61–1.24)
Creatinine, Ser: 1.26 mg/dL — ABNORMAL HIGH (ref 0.61–1.24)
GFR calc Af Amer: 47 mL/min — ABNORMAL LOW (ref >60)
GFR calc Af Amer: >60 mL/min (ref >60)
GFR calc non Af Amer: 56 mL/min — ABNORMAL LOW (ref >60)
GFR, EST NON AFRICAN AMERICAN: 40 mL/min — AB (ref >60)
GLUCOSE: 76 mg/dL (ref 65–99)
GLUCOSE: 114 mg/dL — AB (ref 65–99)
POTASSIUM: 3.7 mmol/L (ref 3.5–5.1)
Potassium: 3.5 mmol/L (ref 3.5–5.1)
SODIUM: 150 mmol/L — AB (ref 135–145)
Sodium: 148 mmol/L — ABNORMAL HIGH (ref 135–145)

## 2017-03-21 LAB — CBC WITH DIFFERENTIAL/PLATELET
Basophils Absolute: 0 10*3/uL (ref 0.0–0.1)
Basophils Relative: 0 %
EOS PCT: 1 %
Eosinophils Absolute: 0.1 10*3/uL (ref 0.0–0.7)
HCT: 27.1 % — ABNORMAL LOW (ref 39.0–52.0)
Hemoglobin: 8.7 g/dL — ABNORMAL LOW (ref 13.0–17.0)
LYMPHS ABS: 0.8 10*3/uL (ref 0.7–4.0)
LYMPHS PCT: 17 %
MCH: 27.5 pg (ref 26.0–34.0)
MCHC: 32.1 g/dL (ref 30.0–36.0)
MCV: 85.8 fL (ref 78.0–100.0)
MONO ABS: 0.3 10*3/uL (ref 0.1–1.0)
Monocytes Relative: 7 %
Neutro Abs: 3.4 10*3/uL (ref 1.7–7.7)
Neutrophils Relative %: 75 %
PLATELETS: 218 10*3/uL (ref 150–400)
RBC: 3.16 MIL/uL — ABNORMAL LOW (ref 4.22–5.81)
RDW: 15.2 % (ref 11.5–15.5)
WBC: 4.6 10*3/uL (ref 4.0–10.5)

## 2017-03-21 MED ORDER — CLONAZEPAM 1 MG PO TABS: 1.0000 mg | ORAL_TABLET | Freq: Two times a day (BID) | ORAL | 0 refills | Status: AC | PRN

## 2017-03-21 MED ORDER — METOCLOPRAMIDE HCL 5 MG/5ML PO SOLN: 5.0000 mg | Freq: Three times a day (TID) | ORAL | 0 refills | Status: AC

## 2017-03-21 MED ORDER — CLONAZEPAM 1 MG PO TABS: 1.0000 mg | ORAL_TABLET | Freq: Two times a day (BID) | ORAL | Status: DC | PRN

## 2017-03-21 MED ORDER — METOCLOPRAMIDE HCL 5 MG/5ML PO SOLN
5.0000 mg | Freq: Three times a day (TID) | ORAL | Status: DC
  Administered 2017-03-21 (×2): 5 mg via ORAL
  Filled 2017-03-21 (×2): qty 5

## 2017-03-21 MED ORDER — HEPARIN SOD (PORK) LOCK FLUSH 100 UNIT/ML IV SOLN: 500.0000 [IU] | INTRAVENOUS | Status: DC | PRN

## 2017-03-21 MED ORDER — DEXTROSE 5 % IV SOLN
INTRAVENOUS | Status: AC
  Administered 2017-03-21: 12:00:00 via INTRAVENOUS

## 2017-03-21 NOTE — Progress Notes (Signed)
IP PROGRESS NOTE  Subjective:   [name] reports the gastrostomy tube became blocked when he had broth. The tube is now foot to wall suction. He feels full if the tube is clamped for 30 minutes.  He plans to go home with home care.  He states that he would like to see Dr.Shadad again.   Objective: Vital signs in last 24 hours: Blood pressure 94/69, pulse (!) 104, temperature 97.8 F (36.6 C), temperature source Oral, resp. rate 18, height 6\' 1"  (1.854 m), weight 143 lb 1.6 oz (64.9 kg), SpO2 96 %.  Intake/Output from previous day: 09/27 0701 - 09/28 0700 In: 6340 [P.O.:1040; I.V.:3300; NG/GT:700] Out: 6200 [Urine:1600; Emesis/NG output:3600; Drains:1000]  Physical Exam:  Blood-tinged urine in the nephrostomy bags Large amount of brown drainage from the gastrostomy tube    Portacath/PICC-without erythema  Lab Results:  BMET  Recent Labs  03/20/17 1131 03/21/17 0444  NA 149* 150*  K 4.1 3.7  CL 117* 123*  CO2 18* 18*  GLUCOSE 71 76  BUN 59* 32*  CREATININE 3.42* 1.64*  CALCIUM 8.2* 8.0*     Medications: I have reviewed the patient's current medications.  Assessment/Plan:  1. 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 Acute renal failure secondary to obstructive uropathy  Cystoscopy 03/13/2017 confirmed tumor involving the bladder, unable to place ureter stents  Placement of bilateral percutaneous nephrostomy tubes 03/19/2017 3. Small bowel obstruction, Status post placement of a palliative venting gastrostomy tube 03/22/2017 4. Weight loss/failure to thrive secondary to #1  [name] appears unchanged. He has advanced metastatic goblet cell carcinoid-adenocarcinoma the appendix. He underwent placement of palliative gastrostomy and nephrostomy tubes earlier this week. The renal function has improved. He is  unable to tolerate clamping of the gastrostomy tube for a long period.  [name] has a poor prognosis for survival beyond weeks. It will be difficult for him to maintain hydration and nutrition with the bowel obstruction. I recommend hospice care.  He indicates that he wants to go home in order to become stronger. He declines Hospice care.  [name] indicated he would like Dr. Alen Blew to assume oncology care at discharge. I discussed the case with Dr.Shadad. He is willing to see [name] as an outpatient during the week of 03/31/2017. He will not serve as a primary physician with hospice.     Recommendations:  1. evaluation of the gastrostomy tube by interventional radiology, consider upsizing the tube for better gravity drainage 2. continue discussions regarding home Hospice with the palliative care team 3. Outpatient follow-up will be scheduled at the [facility] at discharge. He will need a primary physician closer to home to provide orders to home care nursing or Hospice.  Please call Oncology as needed over the weekend. Myself or Dr. Alen Blew we will see him 03/24/2017 if he remains in the hospital.    LOS: 11 days   Donneta Romberg, MD   03/21/2017, 12:46 PM

## 2017-03-21 NOTE — Care Management (Signed)
03/21/2017 Pt for d/c today, this CM spoke with [facility] rep Butch Penny and discussed pt d/c needs ( G-Tube, and Nephrostomy tubes) as well as PT/OT needs. No DME needs at this time per pt and wife.

## 2017-03-21 NOTE — Progress Notes (Signed)
Referring Physician(s):  Dr. Landis Gandy  Supervising Physician: [name]  Patient Status:  [facility] - In-pt  Chief Complaint:  Metastatic goblet cell carcinoid-adenocarcinoma of the appendix  Subjective: Patient wanting information and discussion re: G-tube.  Wants multiple ways to connect/disconnect so that he will be able to manage drainage at home.  NGT out.   Allergies: Fish allergy and Strawberry extract  Medications: Prior to Admission medications   Medication Sig Start Date End Date Taking? Authorizing Provider  MAGNESIUM PO Take 1 tablet by mouth daily.   Yes [provider]  Multiple Vitamin (MULITIVITAMIN WITH MINERALS) TABS Take 1 tablet by mouth daily.   Yes [provider]  potassium chloride (K-DUR,KLOR-CON) 10 MEQ tablet Take 10 mEq by mouth 2 (two) times daily.   Yes [provider]  ranitidine (ZANTAC) 75 MG tablet Take 75 mg by mouth daily as needed. For acid reflux   Yes [provider]  clonazePAM (KLONOPIN) 1 MG tablet Take 1 tablet (1 mg total) by mouth 2 (two) times daily as needed (anxiety). 03/21/17   Elwin Mocha, MD  metoCLOPramide (REGLAN) 5 MG/5ML solution Take 5 mLs (5 mg total) by mouth 4 (four) times daily -  before meals and at bedtime. 03/21/17 04/10/17  Elwin Mocha, MD     Vital Signs: BP 94/69 (BP Location: Left Arm)   Pulse (!) 104   Temp 97.8 F (36.6 C) (Oral)   Resp 18   Ht 6\' 1"  (1.854 m)   Wt 143 lb 1.6 oz (64.9 kg)   SpO2 96%   BMI 18.88 kg/m   Physical Exam  NAD, alert Abd:  Gastrostomy in place currently to suction. No new blood or bruising, no hematoma.  Bilateral nephrostomy tubes in place. Insertion sites clean and dry.  Bloody urine draining from tubes.   Imaging: Ir Gastrostomy Tube Mod Sed  Result Date: 03/19/2017 CLINICAL DATA:  Metastatic malignant appendiceal carcinoid and need for a decompressive gastrostomy tube due to relative gastric outlet obstruction  with intractable nausea and vomiting. EXAM: PERCUTANEOUS GASTROSTOMY TUBE PLACEMENT ANESTHESIA/SEDATION: 2.0 mg IV Versed; 50 mcg IV Fentanyl. Total Moderate Sedation Time 18 minutes. The patient's level of consciousness and physiologic status were continuously monitored during the procedure by Radiology nursing. CONTRAST:  10 mL Isovue-300 MEDICATIONS: 2 g IV Ancef. IV antibiotic was administered in an appropriate time interval prior to needle puncture of the skin. FLUOROSCOPY TIME:  1 minutes and 12 seconds.  5.4 mGy. PROCEDURE: The procedure, risks, benefits, and alternatives were explained to the patient. Questions regarding the procedure were encouraged and answered. The patient understands and consents to the procedure. A time-out was performed prior to initiating the procedure. A pre-existing nasogastric tube was used to insufflate the stomach with air under fluoroscopy. Prior to insufflation, the nasogastric tube was connected to suction in order to decompress the stomach. The abdominal wall was prepped with chlorhexidine in a sterile fashion, and a sterile drape was applied covering the operative field. A sterile gown and sterile gloves were used for the procedure. Local anesthesia was provided with 1% Lidocaine. A skin incision was made in the upper abdominal wall. Under fluoroscopy, an 18 gauge trocar needle was advanced into the stomach. Contrast injection was performed to confirm intraluminal position of the needle tip. A single T tack was then deployed in the lumen of the stomach. This was brought up to tension at the skin surface. Over a guidewire, the tract was dilated to  77 [place] and a 9 [place] gastrostomy tube advanced into the stomach. The catheter was injected with contrast material to confirm position and a fluoroscopic spot image saved. The tube was then flushed with saline. A Prolene retention suture was applied at the catheter exit site. A dressing was applied over the  gastrostomy exit site. COMPLICATIONS: None. FINDINGS: The stomach distended well with air allowing safe placement of the gastrostomy tube. After placement, the tip of the gastrostomy tube lies in the proximal body of the stomach. IMPRESSION: Percutaneous gastrostomy with placement of a 14 French tube in the body of the stomach. Electronically Signed   By: [name] M.D.   On: 03/19/2017 09:20   Dg Abd Portable 1v-small Bowel Obstruction Protocol-initial, 8 Hr Delay  Result Date: 03/20/2017 CLINICAL DATA:  Small-bowel obstruction, followup EXAM: PORTABLE ABDOMEN - 1 VIEW COMPARISON:  CT abdomen pelvis of 03/12/2017 . FINDINGS: bilateral nephrostomy tubes are present. A pigtail catheter overlies the left upper quadrant. NG tube tip overlies the expected mid body of the stomach. Considerable improvement and gaseous distention of small bowel is present IMPRESSION: 1. Improvement in degree of small bowel distension. 2. Bilateral nephrostomy tubes are present. Electronically Signed   By: Ivar Drape M.D.   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 Patient now with malignant bilateral hydronephrosis and request made for placement of bilateral percutaneous nephrostomy catheter for urinary diversion and palliative purposes. EXAM: 1. ULTRASOUND GUIDANCE FOR PUNCTURE OF THE BILATERAL RENAL COLLECTING SYSTEM 2. BILATERAL PERCUTANEOUS NEPHROSTOMY TUBE PLACEMENT. COMPARISON:  CT abdomen pelvis - 03/12/2017 MEDICATIONS: Patient is currently admitted to the hospital and receiving intravenous antibiotics; The antibiotic was administered in an appropriate time frame prior to skin puncture. ANESTHESIA/SEDATION: Moderate (conscious) sedation was employed during this procedure. A total of Versed 2.5 mg and Fentanyl 100 mcg was administered intravenously.  Moderate Sedation Time: 15 minutes. The patient's level of consciousness and vital signs were monitored continuously by radiology nursing throughout the procedure under my direct supervision. CONTRAST:  A total of approximately 25 cc of Isovue 300 was administered into the bilateral collecting systems FLUOROSCOPY TIME:  1 minute 45 seconds (4 mGy) COMPLICATIONS: None immediate. PROCEDURE: The procedure, risks, benefits, and alternatives were explained to the patient. Questions regarding the procedure were encouraged and answered. The patient understands and consents to the procedure. A timeout was performed prior to the initiation of the procedure. The bilateral flanks were prepped with Betadine in a sterile fashion, and a sterile drape was applied covering the operative field. A sterile gown and sterile gloves were used for the procedure. Local anesthesia was provided with 1% Lidocaine with epinephrine. Ultrasound was used to localize the left kidney. Under direct ultrasound guidance, a 21 gauge needle was advanced into the renal collecting system. An ultrasound image documentation was performed. Access within the collecting system was confirmed with the efflux of urine followed by contrast injection. As such, the tract was dilated with an Accustick stent. Over a short Amplatz wire, a 10-French percutaneous nephrostomy catheter was advanced into the collecting system where the coil was formed and locked. Contrast was injected and several spot fluoroscopic were obtained in various obliquities confirming access. The catheter was secured at the skin with a Prolene retention suture and a gravity bag was placed. The identical procedure was repeated for the contralateral right kidney ultimately allowing placement  of a 10 French percutaneous drainage catheter via a posterior inferior calyx with and coiled and locked within the right renal pelvis. Contrast injection confirmed appropriate positioning. Several spot  fluoroscopic images were obtained in various obliquities confirming appropriate access. The catheter was secured at the skin with a Prolene retention suture in an gravity bag was placed. Dressings were placed. The patient tolerated both procedures well without immediate postprocedural complication. FINDINGS: Ultrasound scanning demonstrates a moderate to severely dilated bilateral renal collecting systems. Under direct ultrasound guidance, posterior inferior calices were targeted bilaterally allowing advancement of bilateral 10-French percutaneous nephrostomy catheters under intermittent fluoroscopic guidance. Contrast injection confirmed appropriate positioning. IMPRESSION: Successful ultrasound and fluoroscopic guided placement of bilateral 10 French PCNs. Electronically Signed   By: Sandi Mariscal M.D.   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 Patient now with malignant bilateral hydronephrosis and request made for placement of bilateral percutaneous nephrostomy catheter for urinary diversion and palliative purposes. EXAM: 1. ULTRASOUND GUIDANCE FOR PUNCTURE OF THE BILATERAL RENAL COLLECTING SYSTEM 2. BILATERAL PERCUTANEOUS NEPHROSTOMY TUBE PLACEMENT. COMPARISON:  CT abdomen pelvis - 03/12/2017 MEDICATIONS: Patient is currently admitted to the hospital and receiving intravenous antibiotics; The antibiotic was administered in an appropriate time frame prior to skin puncture. ANESTHESIA/SEDATION: Moderate (conscious) sedation was employed during this procedure. A total of Versed 2.5 mg and Fentanyl 100 mcg was administered intravenously. Moderate Sedation Time: 15 minutes. The patient's level of consciousness and vital signs were monitored continuously by radiology nursing throughout the procedure under my direct supervision. CONTRAST:  A total of  approximately 25 cc of Isovue 300 was administered into the bilateral collecting systems FLUOROSCOPY TIME:  1 minute 45 seconds (4 mGy) COMPLICATIONS: None immediate. PROCEDURE: The procedure, risks, benefits, and alternatives were explained to the patient. Questions regarding the procedure were encouraged and answered. The patient understands and consents to the procedure. A timeout was performed prior to the initiation of the procedure. The bilateral flanks were prepped with Betadine in a sterile fashion, and a sterile drape was applied covering the operative field. A sterile gown and sterile gloves were used for the procedure. Local anesthesia was provided with 1% Lidocaine with epinephrine. Ultrasound was used to localize the left kidney. Under direct ultrasound guidance, a 21 gauge needle was advanced into the renal collecting system. An ultrasound image documentation was performed. Access within the collecting system was confirmed with the efflux of urine followed by contrast injection. As such, the tract was dilated with an Accustick stent. Over a short Amplatz wire, a 10-French percutaneous nephrostomy catheter was advanced into the collecting system where the coil was formed and locked. Contrast was injected and several spot fluoroscopic were obtained in various obliquities confirming access. The catheter was secured at the skin with a Prolene retention suture and a gravity bag was placed. The identical procedure was repeated for the contralateral right kidney ultimately allowing placement of a 10 French percutaneous drainage catheter via a posterior inferior calyx with and coiled and locked within the right renal pelvis. Contrast injection confirmed appropriate positioning. Several spot fluoroscopic images were obtained in various obliquities confirming appropriate access. The catheter was secured at the skin with a Prolene retention suture in an gravity bag was placed. Dressings were placed. The patient  tolerated both procedures well without immediate postprocedural complication. FINDINGS: Ultrasound scanning demonstrates a moderate to severely dilated bilateral renal  collecting systems. Under direct ultrasound guidance, posterior inferior calices were targeted bilaterally allowing advancement of bilateral 10-French percutaneous nephrostomy catheters under intermittent fluoroscopic guidance. Contrast injection confirmed appropriate positioning. IMPRESSION: Successful ultrasound and fluoroscopic guided placement of bilateral 10 French PCNs. Electronically Signed   By: Sandi Mariscal M.D.   On: 03/19/2017 17:24    Labs:  CBC:  Recent Labs  03/12/17 0517 03/13/17 0208 03/20/17 0847 03/21/17 0444  WBC 5.5 7.9 3.9* 4.6  HGB 12.1* 11.4* 9.7* 8.7*  HCT 36.6* 35.6* 29.8* 27.1*  PLT 166 196 205 218    COAGS:  Recent Labs  03/15/17 1428  INR 1.48    BMP:  Recent Labs  03/19/17 0309 03/20/17 1131 03/21/17 0444 03/21/17 1345  NA 141 149* 150* 148*  K 4.5 4.1 3.7 3.5  CL 106 117* 123* 123*  CO2 18* 18* 18* 18*  GLUCOSE 65 71 76 114*  BUN 141* 59* 32* 20  CALCIUM 8.0* 8.2* 8.0* 8.0*  CREATININE 11.77* 3.42* 1.64* 1.26*  GFRNONAA 4* 17* 40* 56*  GFRAA 4* 19* 47* >60    LIVER FUNCTION TESTS:  Recent Labs  03/12/17 0517 03/18/17 1845 03/19/17 0309 03/20/17 1131  BILITOT 1.6*  --   --   --   AST 40  --   --   --   ALT 20  --   --   --   ALKPHOS 51  --   --   --   PROT 5.4*  --   --   --   ALBUMIN 2.8* 2.2* 2.0* 2.2*    Assessment and Plan: Metastatic goblet cell carcinoid-adenocarcinoma of the appendix s/p venting gastrostomy placed 03/18/17 and bilateral nephrostomy tube placement 03/19/17 Patient discouraged this morning due to being connected to foley bag. He has many questions related to venting gastrostomy placed this week.  PA to bedside to discuss tube, and to clarify questions and concerns.  Discussed connection options. Provided patient with equipment including  new bag and drain cap.  Reassured patient that the tube placed is whole and not "pieced together incorrectly."  Showed him how to use tube and allow patient to practice connecting/disconnecting.   He is continuing with liquids only for now.  Patient states he feels better after discussion. Ongoing discussion re: home health vs. Hospice.  IR available as needed.   Electronically Signed: Docia Barrier, PA 03/21/2017, 4:10 PM   I spent a total of 15 Minutes at the the patient's bedside AND on the patient's hospital floor or unit, greater than 50% of which was counseling/coordinating care for metastatic goblet cell carcinoid-adenocarcinoma of the appendix

## 2017-03-21 NOTE — Progress Notes (Signed)
I met with the patient his nurse and the palliative care nurse to discuss the patient's prognosis. I explained to him at length that he should be in hospice due to the fact that he cannot easily retain free water. His bowel in all likelihood has some degree of remaining obstruction as he is put almost no output through his stoma. I spoke to general surgery and they have nothing to offer the patient. In addition, his gastrostomy tube is putting out a significant amount of drainage.Patient is putting out more than he is putting in. Patient still adamantly against hospice. Most ago home with home health. Explained that he has hypernatremia that will likely worsen. Discussed case with nephrology who agrees that unless patient were to go home with dextrose drip continuously he will not do well outpatient setting and should be palliative. Patient states that he is against going hospice because he needs to be alive for his wife's financial situation. When asked to explain the reasoning for this is unclear. Patient was explained at length by multiple individuals the risk and benefits of going home in his current condition and voiced clear understanding. You I'll have her in multiple conversations has stated that he hopes to recover soon. On talking with oncology Dr. Benay Spice the patient refuses to see today patient is no longer a candidate for chemotherapy. Plan is discharge.

## 2017-03-21 NOTE — Discharge Summary (Signed)
Physician Discharge Summary  Christopher Potts ZOX:[id number] DOB: [birth year] DOA: 03/10/2017  PCP: [name], MD  Admit date: 03/10/2017 Discharge date: 03/21/2017  Time spent: 40 minutes  Recommendations for Outpatient Follow-up:  1. F/u with PCP 2. D/c with [place]  Unable to access CSRS at the time of discharge on 9/28 at approximately 4:05 PM after multiple attempts  Discharge Diagnoses:  Principal Problem:   Acute renal failure (ARF) ([place]) Active Problems:   Carcinoid tumor of appendix   Prolonged QT interval   Nausea & vomiting   Hypokalemia   DNR (do not resuscitate) discussion   Palliative care by specialist   DNR (do not resuscitate)   Discharge Condition: Guarded  Diet recommendation: Regular  Filed Weights   03/12/17 2016 03/13/17 0834 03/13/17 2215  Weight: 60.8 kg (134 lb) 60.8 kg (134 lb) 64.9 kg (143 lb 1.6 oz)    History of present illness:  Christopher Potts is a 71 y.o. male with medical history significant of carcinoid tumor of the appendix, malignant who was admitted last week at [facility] for "SBO, treated conservatively, still having N/V" as of 9/10, with recommendation to f/u with oncology for progression of disease seen on CT.  His family reports that he is here with dehydration.  "We think it's because of the immune therapy I've had 3 times.  I was dehydrated down to nothing.  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  Started immunotherapy about 6 weeks ago, 1 treatment every other week (due Tuesday but he declined).  He thinks the loose bowels and n/v with resultant dehydration started right after the immunotherapy.  "It tends to dry out everything".  Each treatment it got worse.  He was hospitaized last week in [place]; they stuck a tube down my throat and pumped out acid and put fluid in my veins.   Eventually they took the tube out and were giving liquids.  He has been able to tolerate PO - ate a scrambled egg and Boost this AM without difficulty.  X-rays and CTs last week were reportedly unremarkable.  Review of Care Everywhere shows that the patient was hospitalized from 8/29-9/2 in [place] for partial SOB.    Hospital Course:   Pt with NV. Had SBO and bil hydronephrosis likely from met cancer. NGT to relieve SBO effective. Discussed with surgery and no intervention possible. Will likely be terminal blockage. G tube placed for venting. Typical size tube placed. Discussed upsizing with IR but not possible until tract matures. Nephrostomy tubes placed and aki improved. Advised pt due to frequent free water loss and met cancer he may not live lone. Advised hosice for extra support. He declined stating he needs to live for his wife for financial reasons. Went from DNR to Full code. D/c with H/H only.   Interventional radiology to follow patient in 6 weeks.   Procedures:  G tube placement, Nephrostomy tubs bil  Consultations:  Radiology, Oncology, Urology, Nephrology, Palliative  Discharge Exam: Vitals:   03/21/17 0434 03/21/17 1000  BP: 98/70 94/69  Pulse: (!) 106 (!) 104  Resp: 18 18  Temp: 98 F (36.7 C) 97.8 F (36.6 C)  SpO2: 98% 96%    General: NAD Cardiovascular: RRR, no MRG Respiratory: CTAB, nl WOB  Discharge Instructions    Discharge Medication List as of 03/21/2017  5:29  PM    START taking these medications   Details  clonazePAM (KLONOPIN) 1 MG tablet Take 1 tablet (1 mg total) by mouth 2 (two) times daily as needed (anxiety)., Starting Fri 03/21/2017, Print    metoCLOPramide (REGLAN) 5 MG/5ML solution Take 5 mLs (5 mg total) by mouth 4 (four) times daily -  before meals and at bedtime., Starting Fri 03/21/2017, Until Thu 04/10/2017, Normal      CONTINUE these medications which have NOT CHANGED   Details  Multiple Vitamin (MULITIVITAMIN WITH MINERALS)  TABS Take 1 tablet by mouth daily., Until Discontinued, Historical Med    ranitidine (ZANTAC) 75 MG tablet Take 75 mg by mouth daily as needed. For acid reflux, Until Discontinued, Historical Med      STOP taking these medications     MAGNESIUM PO      potassium chloride (K-DUR,KLOR-CON) 10 MEQ tablet        Allergies  Allergen Reactions  . Fish Allergy Swelling    When eat fish and strawberries together but can eat separately   . Strawberry Extract     When eaten with Fish, but can have separately       The results of significant diagnostics from this hospitalization (including imaging, microbiology, ancillary and laboratory) are listed below for reference.    Significant Diagnostic Studies: Ct Abdomen Pelvis Wo Contrast  Result Date: 03/12/2017 CLINICAL DATA:  Shortness of breath, nausea and vomiting. Appendiceal carcinoid. Loose bowels. EXAM: CT ABDOMEN AND PELVIS WITHOUT CONTRAST TECHNIQUE: Multidetector CT imaging of the abdomen and pelvis was performed following the standard protocol without IV contrast. COMPARISON:  01/06/2017. FINDINGS: Lower chest: Peribronchovascular ground-glass and consolidation in the lingula and both lower lobes. Tiny right pleural effusion. Heart size normal. No pericardial effusion. Distal esophagus is dilated and fluid-filled. Hepatobiliary: Liver and gallbladder are grossly unremarkable. A 1.7 x 3.4 cm low-attenuation collection along the posterior border of the right hepatic lobe is likely a subcapsular fluid collection. There is significant streak artifact from the patient's arms. No definite biliary ductal dilatation. Pancreas: Grossly unremarkable. Spleen: Negative. Adrenals/Urinary Tract: There may be slight thickening of the right adrenal gland. Left adrenal gland is grossly unremarkable. Small stones in the kidneys. Moderate right hydronephrosis without cause identified. Persistent severe left hydronephrosis. Left ureter is difficult to follow  without IV contrast. Bladder is low in volume with possible wall thickening. Perivesical stranding and fluid, progressive. Stomach/Bowel: Stomach and proximal small bowel are distended with fluid. Horizontal portion of the duodenum measures up to 4.4 cm. There may be a jejunal transition point in the left lower quadrant. Left lower quadrant colostomy. Right and left hemicolectomies with a left lower quadrant colostomy. Rectosigmoid colon appears tethered to the sacrum and may be postoperative in etiology. Vascular/Lymphatic: Mild atherosclerotic calcification of the aorta. No definite pathologically enlarged lymph nodes. Reproductive: Prostate is visualized. Other: Presacral edema. Small left inguinal hernia contains fat. No free fluid. Musculoskeletal: No worrisome lytic or sclerotic lesions. Degenerative changes in the spine. IMPRESSION: 1. Distended and fluid-filled distal esophagus, stomach and proximal small bowel, indicative of obstruction, with a possible jejunal transition point in the left lower quadrant. 2. Lingular and bilateral lower lobe ground-glass and consolidation may be due to aspiration or pneumonia. 3. Tiny right pleural effusion. 4. Low-attenuation along the posterior right hepatic lobe is suspicious for a small subcapsular fluid collection. 5. Bilateral renal stones. Bilateral hydronephrosis, etiology indeterminate. 6. Low volume bladder with possible wall thickening. Perivesical edema and stranding. Difficult to exclude  cystitis. 7.  Aortic atherosclerosis (ICD10-170.0). Electronically Signed   By: Lorin Picket M.D.   On: 03/12/2017 08:58   US Abdomen Complete  Result Date: 03/11/2017 CLINICAL DATA:  Nausea and vomiting. EXAM: ABDOMEN ULTRASOUND COMPLETE COMPARISON:  None. FINDINGS: Gallbladder: 15 mm gallstone. No gallbladder wall thickening, pericholecystic fluid or other secondary signs of acute cholecystitis. No sonographic Murphy's sign. Common bile duct: Diameter: Normal at 3 mm.  Liver: No focal lesion identified. Within normal limits in parenchymal echogenicity. Portal vein is patent on color Doppler imaging with normal direction of blood flow towards the liver. IVC: No abnormality visualized. Pancreas: Visualized portion unremarkable. Spleen: Obscured by the overlying stomach which appears to be distended with recently ingested material. Right Kidney: Length: 11.3 cm. Mild hydronephrosis. Cortical thickness and echogenicity is within normal limits without mass. No renal stones seen. Left Kidney: Length: 12.8 cm. Moderate hydronephrosis. Cortical thickness and echogenicity is within normal limits. No mass. Lower pole left renal stone measures 1.7 cm greatest dimension. Abdominal aorta: No aneurysm visualized. Other findings: Bladder is decompressed. IMPRESSION: 1. Moderate left-sided hydronephrosis. Mild right-sided hydronephrosis. Consider CT abdomen and pelvis for further characterization. Bladder is decompressed. Left renal stone measures 1.7 cm. 2. Cholelithiasis without evidence of acute cholecystitis. No bile duct dilatation. 3. Spleen is obscured by the overlying stomach which appears to be distended with recently ingested fluid/material. Electronically Signed   By: Franki Cabot M.D.   On: 03/11/2017 23:27   Ir Gastrostomy Tube Mod Sed  Result Date: 03/19/2017 CLINICAL DATA:  Metastatic malignant appendiceal carcinoid and need for a decompressive gastrostomy tube due to relative gastric outlet obstruction with intractable nausea and vomiting. EXAM: PERCUTANEOUS GASTROSTOMY TUBE PLACEMENT ANESTHESIA/SEDATION: 2.0 mg IV Versed; 50 mcg IV Fentanyl. Total Moderate Sedation Time 18 minutes. The patient's level of consciousness and physiologic status were continuously monitored during the procedure by Radiology nursing. CONTRAST:  10 mL Isovue-300 MEDICATIONS: 2 g IV Ancef. IV antibiotic was administered in an appropriate time interval prior to needle puncture of the skin. FLUOROSCOPY  TIME:  1 minutes and 12 seconds.  5.4 mGy. PROCEDURE: The procedure, risks, benefits, and alternatives were explained to the patient. Questions regarding the procedure were encouraged and answered. The patient understands and consents to the procedure. A time-out was performed prior to initiating the procedure. A pre-existing nasogastric tube was used to insufflate the stomach with air under fluoroscopy. Prior to insufflation, the nasogastric tube was connected to suction in order to decompress the stomach. The abdominal wall was prepped with chlorhexidine in a sterile fashion, and a sterile drape was applied covering the operative field. A sterile gown and sterile gloves were used for the procedure. Local anesthesia was provided with 1% Lidocaine. A skin incision was made in the upper abdominal wall. Under fluoroscopy, an 18 gauge trocar needle was advanced into the stomach. Contrast injection was performed to confirm intraluminal position of the needle tip. A single T tack was then deployed in the lumen of the stomach. This was brought up to tension at the skin surface. Over a guidewire, the tract was dilated to 28 [place] and a 14 [place] gastrostomy tube advanced into the stomach. The catheter was injected with contrast material to confirm position and a fluoroscopic spot image saved. The tube was then flushed with saline. A Prolene retention suture was applied at the catheter exit site. A dressing was applied over the gastrostomy exit site. COMPLICATIONS: None. FINDINGS: The stomach distended well with air allowing safe placement of  the gastrostomy tube. After placement, the tip of the gastrostomy tube lies in the proximal body of the stomach. IMPRESSION: Percutaneous gastrostomy with placement of a 14 French tube in the body of the stomach. Electronically Signed   By: [name] M.D.   On: 03/19/2017 09:20   Dg Abdomen Acute W/chest  Result Date: 03/10/2017 CLINICAL DATA:  Fatigue and  weakness EXAM: DG ABDOMEN ACUTE W/ 1V CHEST COMPARISON:  CT 06/07/2015, radiograph 05/29/2015, CT 01/06/2017 FINDINGS: Single view chest demonstrates a left-sided central venous port with the tip overlying the upper SVC. No acute consolidation or effusion. Normal cardiomediastinal silhouette. No pneumothorax. Supine and upright views of the abdomen demonstrate no free air beneath the diaphragm. Postsurgical changes in the right hemiabdomen and pelvis. Calcified phleboliths in the pelvis. Scattered fluid levels on the upright study with overall nonobstructed gas pattern. Oval opacity in the left upper quadrant could reflect a pill fragment. IMPRESSION: 1. No radiographic evidence for acute cardiopulmonary abnormality. 2. Scattered fluid levels but without definitive obstructive pattern identified 3. Possible pill fragment in the left upper quadrant Electronically Signed   By: Donavan Foil M.D.   On: 03/10/2017 20:56   Dg Abd Portable 1v-small Bowel Obstruction Protocol-initial, 8 Hr Delay  Result Date: 03/20/2017 CLINICAL DATA:  Small-bowel obstruction, followup EXAM: PORTABLE ABDOMEN - 1 VIEW COMPARISON:  CT abdomen pelvis of 03/12/2017 . FINDINGS: bilateral nephrostomy tubes are present. A pigtail catheter overlies the left upper quadrant. NG tube tip overlies the expected mid body of the stomach. Considerable improvement and gaseous distention of small bowel is present IMPRESSION: 1. Improvement in degree of small bowel distension. 2. Bilateral nephrostomy tubes are present. Electronically Signed   By: Ivar Drape M.D.   On: 03/20/2017 08:03   Dg C-arm 1-60 Min-no Report  Result Date: 03/13/2017 Fluoroscopy was utilized by the requesting physician.  No radiographic interpretation.   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 Patient now with malignant bilateral  hydronephrosis and request made for placement of bilateral percutaneous nephrostomy catheter for urinary diversion and palliative purposes. EXAM: 1. ULTRASOUND GUIDANCE FOR PUNCTURE OF THE BILATERAL RENAL COLLECTING SYSTEM 2. BILATERAL PERCUTANEOUS NEPHROSTOMY TUBE PLACEMENT. COMPARISON:  CT abdomen pelvis - 03/12/2017 MEDICATIONS: Patient is currently admitted to the hospital and receiving intravenous antibiotics; The antibiotic was administered in an appropriate time frame prior to skin puncture. ANESTHESIA/SEDATION: Moderate (conscious) sedation was employed during this procedure. A total of Versed 2.5 mg and Fentanyl 100 mcg was administered intravenously. Moderate Sedation Time: 15 minutes. The patient's level of consciousness and vital signs were monitored continuously by radiology nursing throughout the procedure under my direct supervision. CONTRAST:  A total of approximately 25 cc of Isovue 300 was administered into the bilateral collecting systems FLUOROSCOPY TIME:  1 minute 45 seconds (4 mGy) COMPLICATIONS: None immediate. PROCEDURE: The procedure, risks, benefits, and alternatives were explained to the patient. Questions regarding the procedure were encouraged and answered. The patient understands and consents to the procedure. A timeout was performed prior to the initiation of the procedure. The bilateral flanks were prepped with Betadine in a sterile fashion, and a sterile drape was applied covering the operative field. A sterile gown and sterile gloves were used for the procedure. Local anesthesia was provided with 1% Lidocaine with epinephrine. Ultrasound was used to localize the left kidney. Under direct ultrasound guidance, a 21 gauge needle was advanced into  the renal collecting system. An ultrasound image documentation was performed. Access within the collecting system was confirmed with the efflux of urine followed by contrast injection. As such, the tract was dilated with an Accustick stent.  Over a short Amplatz wire, a 10-French percutaneous nephrostomy catheter was advanced into the collecting system where the coil was formed and locked. Contrast was injected and several spot fluoroscopic were obtained in various obliquities confirming access. The catheter was secured at the skin with a Prolene retention suture and a gravity bag was placed. The identical procedure was repeated for the contralateral right kidney ultimately allowing placement of a 10 French percutaneous drainage catheter via a posterior inferior calyx with and coiled and locked within the right renal pelvis. Contrast injection confirmed appropriate positioning. Several spot fluoroscopic images were obtained in various obliquities confirming appropriate access. The catheter was secured at the skin with a Prolene retention suture in an gravity bag was placed. Dressings were placed. The patient tolerated both procedures well without immediate postprocedural complication. FINDINGS: Ultrasound scanning demonstrates a moderate to severely dilated bilateral renal collecting systems. Under direct ultrasound guidance, posterior inferior calices were targeted bilaterally allowing advancement of bilateral 10-French percutaneous nephrostomy catheters under intermittent fluoroscopic guidance. Contrast injection confirmed appropriate positioning. IMPRESSION: Successful ultrasound and fluoroscopic guided placement of bilateral 10 French PCNs. Electronically Signed   By: Simonne Come M.D.   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 Patient now with malignant bilateral hydronephrosis and request made for placement of bilateral percutaneous nephrostomy catheter for urinary diversion and palliative purposes. EXAM: 1. ULTRASOUND GUIDANCE FOR PUNCTURE OF THE BILATERAL RENAL COLLECTING SYSTEM  2. BILATERAL PERCUTANEOUS NEPHROSTOMY TUBE PLACEMENT. COMPARISON:  CT abdomen pelvis - 03/12/2017 MEDICATIONS: Patient is currently admitted to the hospital and receiving intravenous antibiotics; The antibiotic was administered in an appropriate time frame prior to skin puncture. ANESTHESIA/SEDATION: Moderate (conscious) sedation was employed during this procedure. A total of Versed 2.5 mg and Fentanyl 100 mcg was administered intravenously. Moderate Sedation Time: 15 minutes. The patient's level of consciousness and vital signs were monitored continuously by radiology nursing throughout the procedure under my direct supervision. CONTRAST:  A total of approximately 25 cc of Isovue 300 was administered into the bilateral collecting systems FLUOROSCOPY TIME:  1 minute 45 seconds (4 mGy) COMPLICATIONS: None immediate. PROCEDURE: The procedure, risks, benefits, and alternatives were explained to the patient. Questions regarding the procedure were encouraged and answered. The patient understands and consents to the procedure. A timeout was performed prior to the initiation of the procedure. The bilateral flanks were prepped with Betadine in a sterile fashion, and a sterile drape was applied covering the operative field. A sterile gown and sterile gloves were used for the procedure. Local anesthesia was provided with 1% Lidocaine with epinephrine. Ultrasound was used to localize the left kidney. Under direct ultrasound guidance, a 21 gauge needle was advanced into the renal collecting system. An ultrasound image documentation was performed. Access within the collecting system was confirmed with the efflux of urine followed by contrast injection. As such, the tract was dilated with an Accustick stent. Over a short Amplatz wire, a 10-French percutaneous nephrostomy catheter was advanced into the collecting system where the coil was formed and locked. Contrast was injected and several spot fluoroscopic were obtained in  various obliquities confirming access. The catheter was secured at the skin with  a Prolene retention suture and a gravity bag was placed. The identical procedure was repeated for the contralateral right kidney ultimately allowing placement of a 10 French percutaneous drainage catheter via a posterior inferior calyx with and coiled and locked within the right renal pelvis. Contrast injection confirmed appropriate positioning. Several spot fluoroscopic images were obtained in various obliquities confirming appropriate access. The catheter was secured at the skin with a Prolene retention suture in an gravity bag was placed. Dressings were placed. The patient tolerated both procedures well without immediate postprocedural complication. FINDINGS: Ultrasound scanning demonstrates a moderate to severely dilated bilateral renal collecting systems. Under direct ultrasound guidance, posterior inferior calices were targeted bilaterally allowing advancement of bilateral 10-French percutaneous nephrostomy catheters under intermittent fluoroscopic guidance. Contrast injection confirmed appropriate positioning. IMPRESSION: Successful ultrasound and fluoroscopic guided placement of bilateral 10 French PCNs. Electronically Signed   By: Sandi Mariscal M.D.   On: 03/19/2017 17:24    Microbiology: Recent Results (from the past 240 hour(s))  Surgical pcr screen     Status: None   Collection Time: 03/12/17  9:56 PM  Result Value Ref Range Status   MRSA, PCR NEGATIVE NEGATIVE Final   Staphylococcus aureus NEGATIVE NEGATIVE Final    Comment: (NOTE) The Xpert SA Assay (FDA approved for NASAL specimens in patients 67 years of age and older), is one component of a comprehensive surveillance program. It is not intended to diagnose infection nor to guide or monitor treatment.      Labs: Basic Metabolic Panel:  Recent Labs Lab 03/18/17 1845 03/19/17 0309 03/20/17 0847 03/20/17 1131 03/21/17 0444 03/21/17 1345  NA 138  141  --  149* 150* 148*  K 4.3 4.5  --  4.1 3.7 3.5  CL 104 106  --  117* 123* 123*  CO2 18* 18*  --  18* 18* 18*  GLUCOSE 72 65  --  71 76 114*  BUN 145* 141*  --  59* 32* 20  CREATININE 12.01* 11.77*  --  3.42* 1.64* 1.26*  CALCIUM 8.2* 8.0*  --  8.2* 8.0* 8.0*  MG 2.4 2.3 1.9  --   --   --   PHOS 6.7* 6.8*  --  3.3  --   --    Liver Function Tests:  Recent Labs Lab 03/18/17 1845 03/19/17 0309 03/20/17 1131  ALBUMIN 2.2* 2.0* 2.2*   No results for input(s): LIPASE, AMYLASE in the last 168 hours. No results for input(s): AMMONIA in the last 168 hours. CBC:  Recent Labs Lab 03/20/17 0847 03/21/17 0444  WBC 3.9* 4.6  NEUTROABS 3.2 3.4  HGB 9.7* 8.7*  HCT 29.8* 27.1*  MCV 84.7 85.8  PLT 205 218   Cardiac Enzymes: No results for input(s): CKTOTAL, CKMB, CKMBINDEX, TROPONINI in the last 168 hours. BNP: BNP (last 3 results) No results for input(s): BNP in the last 8760 hours.  ProBNP (last 3 results) No results for input(s): PROBNP in the last 8760 hours.  CBG: No results for input(s): GLUCAP in the last 168 hours.     Signed:  Elwin Mocha MD  FACP  [facility] 03/21/2017, 3:50 PM

## 2017-03-21 NOTE — Progress Notes (Signed)
Patient was discharged to home. Supplies provided and education for G-tube and nephrotomies completed. AVS reviewed, IV team called to deaccess port.

## 2017-03-21 NOTE — Evaluation (Addendum)
Occupational Therapy Evaluation and defer to [facility] Patient Details Name: Christopher Potts MRN: [id number] DOB: 02/16/1946 Today's Date: 03/21/2017    History of Present Illness 71 y.o. male  admitted on 03/10/2017 with past medical history significant for Metastatic  appendiceal cancer with peritoneal carcinomatosis, Diagnosed with appendiceal cancer in Dec 2016. 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   Clinical Impression   PTA Pt modified independent in ADL and mobility. OT spent extensive time with Pt talking about home set up and with Pt explaining how he has already bought new clothing (overalls) to accommodate his tubes and will have 24 hour help from wife, daughter, grandson (big strong 200lbs guy "he can pick me up like a rag doll if I need him to" Pt very pleasant and talkative during entire session. Pt does not need any further DME for ADL and resports he has adequate DME for mobility "I made myself a 4 wheel walker from 2 - 2 wheeled walkers. When it came time to get OOB the Pt adamantly refused. "I am not getting out of bed to make you or any doctor happy, I know I can get out of this bed and I will be fine" OT explained how blood pressure can be affected, how we need to see what he can do to be safe at home, that you lose a lot of strength and activity endurance while in the bed, and that OT needs to make sure he gets the help he needs so when he is getting ready to go home today he doesn't have any surprises in performance. Pt continued to refuse "I got all my tubes where I like them, I won't have you messing them up" OT explained that therapy assists Pt to move with multiple tubes and lines all the time and that I would return him to bed, make him very comfortable to his satisfaction, and he continued to decline even long sitting. Just for clarification, the Pt was not rude, no yelling or ugly language but simply would not move to EOB or even perform rolling. An extensive amount of time was spent  verbally going over home set up and evaluating upper extremities for strength in being able to assist on DME (like walker). At this time, OT to sign off acutely since Pt is supposed to go home today. Should the situation change feel free to re-order. OT recommends HHOT but question Pt's willingness to participate.    Follow Up Recommendations  Home health OT;Supervision/Assistance - 24 hour    Equipment Recommendations  3 in 1 bedside commode (Pt declined in session)    Recommendations for Other Services       Precautions / Restrictions Precautions Precautions: Fall Restrictions Weight Bearing Restrictions: No      Mobility Bed Mobility               General bed mobility comments: declined  Transfers                 General transfer comment: refused, "I know I can do it"    Balance                                           ADL either performed or assessed with clinical judgement   ADL Overall ADL's : Needs assistance/impaired Eating/Feeding: Set up;Bed level Eating/Feeding Details (indicate cue type and reason): eating popscicles when  OT entered the room Grooming: Wash/dry hands;Wash/dry face;Set up;Bed level Grooming Details (indicate cue type and reason): bed level Upper Body Bathing: Moderate assistance   Lower Body Bathing: Maximal assistance   Upper Body Dressing : Moderate assistance Upper Body Dressing Details (indicate cue type and reason): talked extensively about the clothes he has purchased and his plan for UB dressing Lower Body Dressing: Maximal assistance Lower Body Dressing Details (indicate cue type and reason): has long handle shoe horn, as wife assists with LLE at baseline   Toilet Transfer Details (indicate cue type and reason): declined/refused at this time   Toileting - Clothing Manipulation Details (indicate cue type and reason): declined/refused at this time Tub/ Shower Transfer: Tub transfer;Tub Immunologist Details (indicate cue type and reason): Pt has the tub bench where you sit on the seat and the whole seat slides in. He has a hand held shower head, he expresses no concerns about being able to get in his tub to shower and has no DME concerns Functional mobility during ADLs:  (declined) General ADL Comments: Pt declines any further DME including a BSC. It sounds like BSC is the only thing that he would benefit from, and that otherwise he has appropriate DME     Vision Baseline Vision/History: Wears glasses Wears Glasses: Distance only Patient Visual Report: No change from baseline Additional Comments: Pt able to read signs around the room, and from handout in room without problem. 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 LUE Sensation: history of peripheral neuropathy   Lower Extremity Assessment Lower Extremity Assessment: LLE deficits/detail;RLE deficits/detail RLE Deficits / Details: neuropathy RLE Sensation: history of peripheral neuropathy LLE Deficits / Details: Pt reports that his knee replacement limits the ROM, in addition to neuropathy LLE Sensation: history of peripheral neuropathy       Communication Communication Communication: No difficulties   Cognition Arousal/Alertness: Awake/alert Behavior During Therapy: WFL for tasks assessed/performed Overall Cognitive Status: Within Functional Limits for tasks assessed                                     General  Comments       Exercises     Shoulder Instructions      Home Living Family/patient expects to be discharged to:: Private residence Living Arrangements: Spouse/significant other Available Help at Discharge: Family;Available 24 hours/day Type of Home: House Home Access: Stairs to enter [employer] of Steps: 3 Entrance Stairs-Rails: Can reach both;Left;Right Home Layout: One level     Bathroom Shower/Tub: Teacher, early years/pre: Standard Bathroom Accessibility: Yes How Accessible: Accessible via walker Home Equipment: [place] - 2 wheels;Tub bench;Grab bars - tub/shower;Cane - single point;Hospital bed;Hand held shower head   Additional Comments: good family support      Prior Functioning/Environment Level of Independence: Independent with assistive device(s)        Comments: hasnot used DME for ambulation recently, uses shower chair  OT Problem List: Decreased safety awareness      OT Treatment/Interventions:      OT Goals(Current goals can be found in the care plan section) Acute Rehab OT Goals Patient Stated Goal: to get my weight back up and re-start chemo OT Goal Formulation: With patient Time For Goal Achievement: 04/04/17 Potential to Achieve Goals: Poor  OT Frequency:     Barriers to D/C:            Co-evaluation              AM-PAC PT "6 Clicks" Daily Activity     Outcome Measure Help from another person eating meals?: None Help from another person taking care of personal grooming?: None Help from another person toileting, which includes using toliet, bedpan, or urinal?: A Lot Help from another person bathing (including washing, rinsing, drying)?: A Lot Help from another person to put on and taking off regular upper body clothing?: A Lot Help from another person to put on and taking off regular lower body clothing?: A Lot 6 Click Score: 16   End of Session Nurse Communication: Other (comment) (refused to  demonstrate any mobility despite education)  Activity Tolerance: Treatment limited secondary to agitation Patient left: in bed;with call bell/phone within reach  OT Visit Diagnosis: Adult, failure to thrive (R62.7);Muscle weakness (generalized) (M62.81)                Time: 3295-1884 OT Time Calculation (min): 34 min Charges:  OT General Charges $OT Visit: 1 Visit OT Evaluation $OT Eval Moderate Complexity: 1 Mod G-Codes:     Hulda Humphrey OTR/L [place] 03/21/2017, 10:23 AM

## 2017-03-21 NOTE — Telephone Encounter (Signed)
Scheduled appt per 9/27 sch msg. Patient is being discharged from hospital and it aware of appt.

## 2017-03-21 NOTE — Care Management Important Message (Signed)
Important Message  Patient Details  Name: Christopher Potts MRN: [id number] Date of Birth: 10-Nov-1945   Medicare Important Message Given:  Yes    Nathen May 03/21/2017, 2:27 PM

## 2017-03-21 NOTE — Progress Notes (Signed)
Palliative Medicine RN Note:  Arrived to check on pt; Dr Aggie Moats and RN in room discussing care options for home and likely rapid decline to due continually rising Na levels. Pt adamant that he does not want hospice, preferring to get [facility] through [facility]. Pt is clear that he wants to die awake and at home and that dying in his sleep is unacceptable, as he wants every chance to ask for "that last little bit of forgiveness." He does not verbalize understanding of what his death will look like, but he does state that he knows he will die; he states he is not dying any time soon. Importantly, the RN reminded him that he told her he doesn't trust his wife to make the right decisions for him if he is unable to make his own decisions but won't designate anyone else as HCPOA.  Plan for Dr Hilma Favors to come later today to help plan as much as possible for d/c. I remain very concerned that pt is unrealistic about his prognosis as explained by both Dr Aggie Moats and PMT NPs. He verbalizes that [facility] will be able to come out 24 hours a day, give him blood and IV fluids, and treat him in place despite discussion by multiple care team members that this is likely more than [place] can do. Discussed this concern with Dr Hilma Favors, who is aware.  Christopher Skiff Juergen Hardenbrook, RN, BSN, [facility] 03/21/2017 1:22 PM Cell [phone] 8:00-4:00 Monday-Friday Office [phone]

## 2017-03-24 ENCOUNTER — Telehealth: Payer: Self-pay | Admitting: *Deleted

## 2017-03-24 DIAGNOSIS — D3A020 Benign carcinoid tumor of the appendix: Secondary | ICD-10-CM

## 2017-03-24 NOTE — Telephone Encounter (Signed)
Call from pt's daughter requesting a "suction pump" for his G-tube. Returned call, she reports they have [place] coming out. Nurse flushed Gtube but at night it gets "blocked."  Asked daughter if pt planned to keep 10/8 appts. She will have to check with him. Reviewed with Dr. Benay Spice: Order received and faxed to [facility].

## 2017-03-26 ENCOUNTER — Other Ambulatory Visit (HOSPITAL_COMMUNITY): Payer: Self-pay | Admitting: Interventional Radiology

## 2017-03-26 ENCOUNTER — Inpatient Hospital Stay
Admission: AD | Admit: 2017-03-26 | Payer: Self-pay | Source: Other Acute Inpatient Hospital | Admitting: Pulmonary Disease

## 2017-03-26 ENCOUNTER — Telehealth (HOSPITAL_COMMUNITY): Payer: Self-pay

## 2017-03-26 DIAGNOSIS — K311 Adult hypertrophic pyloric stenosis: Secondary | ICD-10-CM

## 2017-03-26 DIAGNOSIS — D3A020 Benign carcinoid tumor of the appendix: Secondary | ICD-10-CM

## 2017-03-26 NOTE — Telephone Encounter (Signed)
Called to schedule gtube replacement, left message for pt to call back. AW

## 2017-03-28 ENCOUNTER — Other Ambulatory Visit: Payer: Self-pay | Admitting: *Deleted

## 2017-03-28 DIAGNOSIS — D3A020 Benign carcinoid tumor of the appendix: Secondary | ICD-10-CM

## 2017-03-31 ENCOUNTER — Ambulatory Visit: Payer: Medicare HMO | Admitting: Oncology

## 2017-03-31 ENCOUNTER — Telehealth: Payer: Self-pay

## 2017-03-31 ENCOUNTER — Other Ambulatory Visit: Payer: Medicare HMO

## 2017-03-31 NOTE — Telephone Encounter (Signed)
Call placed to patient regarding his missed appointment today. Pt states that he is currently hospitalized at [facility] and that he has been there for a couple days. Informed pt that this RN would let MD Sherrill know. Pt verbalizes understanding and appreciates call back.

## 2017-04-01 ENCOUNTER — Telehealth (HOSPITAL_COMMUNITY): Payer: Self-pay

## 2017-04-01 NOTE — Telephone Encounter (Signed)
Called to schedule gtube replacement, no answer. AW

## 2017-04-24 DEATH — deceased

## 2018-12-13 IMAGING — US US ABDOMEN COMPLETE
1 series · 13 of 25 positions shown · non-contrast
Comparison: None.

CLINICAL DATA: Nausea and vomiting.

EXAM:
ABDOMEN ULTRASOUND COMPLETE

[Series 1: us abdomen complete · 0.20mm/px · 13 of 75 slices shown]
[im 1/75]
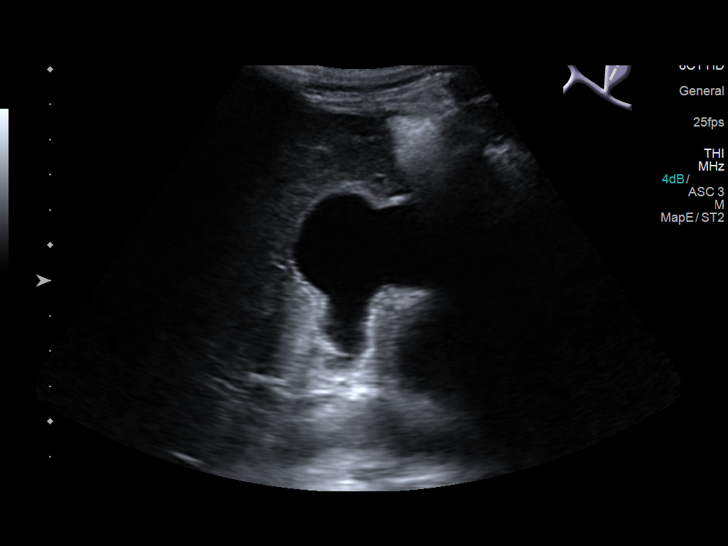
[im 7/75]
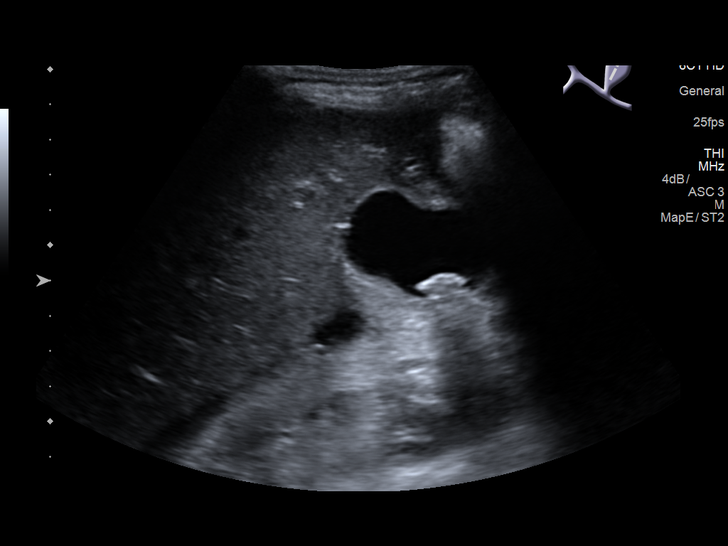
[im 13/75]
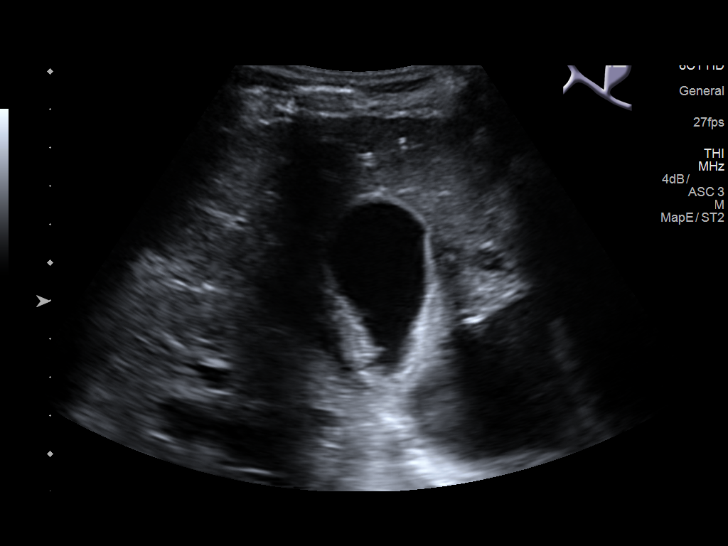
[im 19/75]
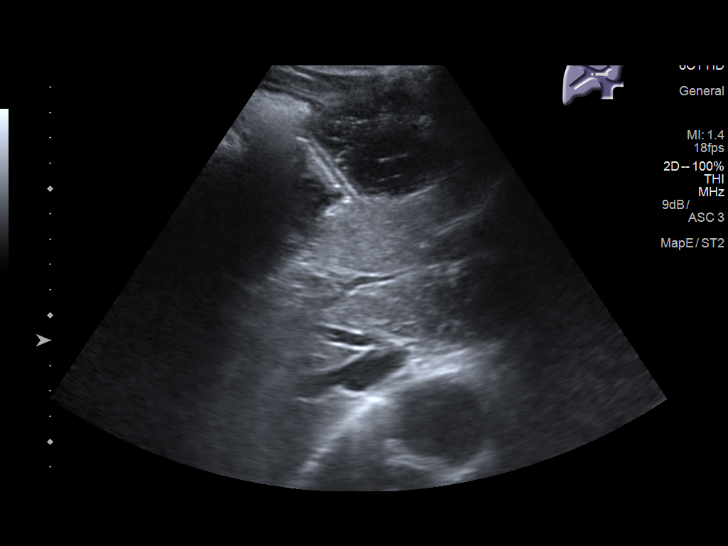
[im 25/75]
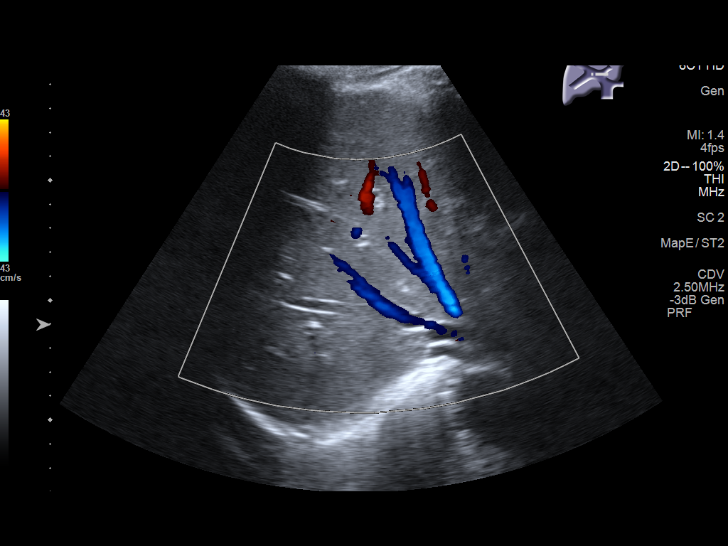
[im 31/75]
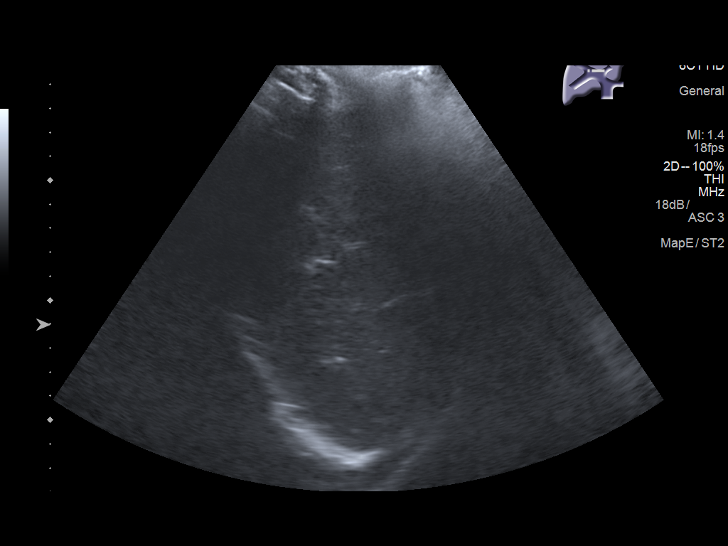
[im 38/75]
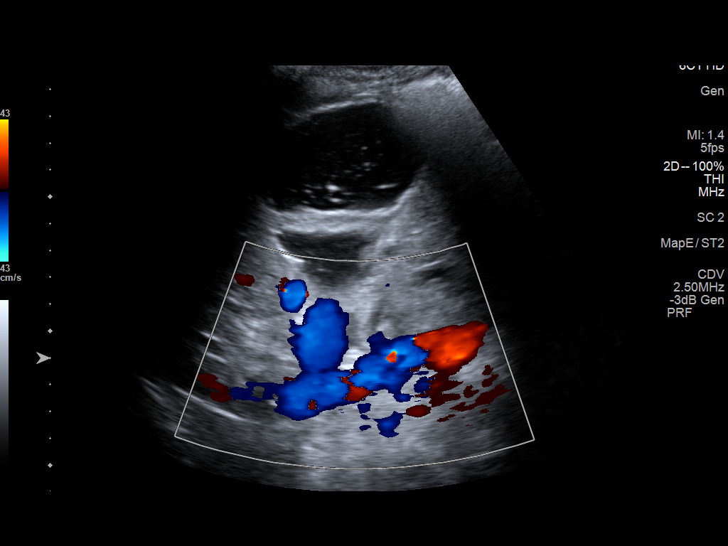
[im 44/75]
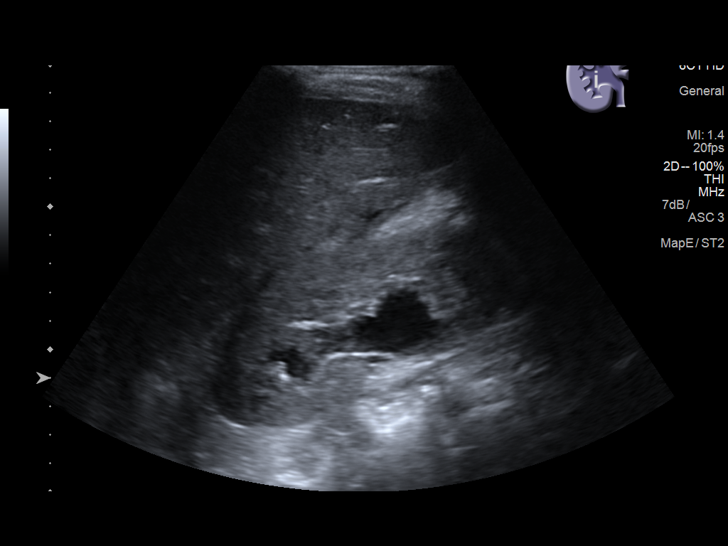
[im 50/75]
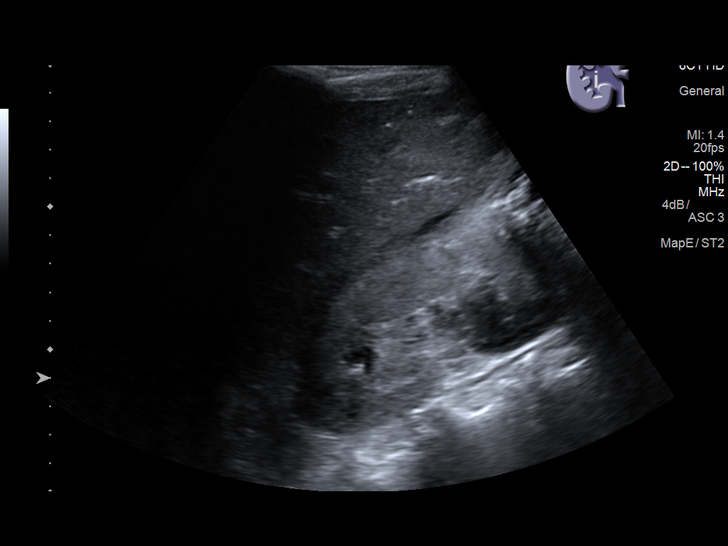
[im 56/75]
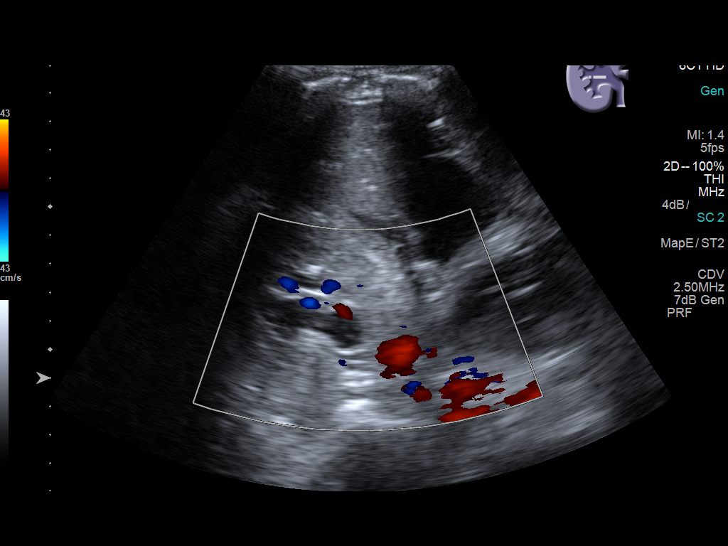
[im 62/75]
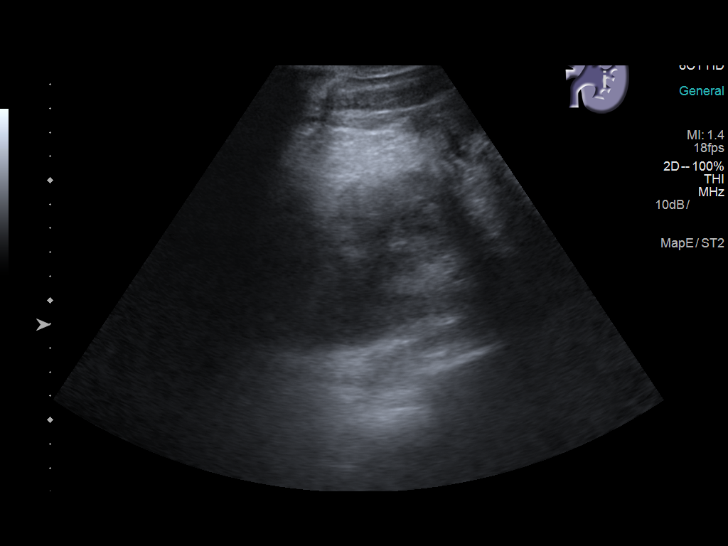
[im 68/75]
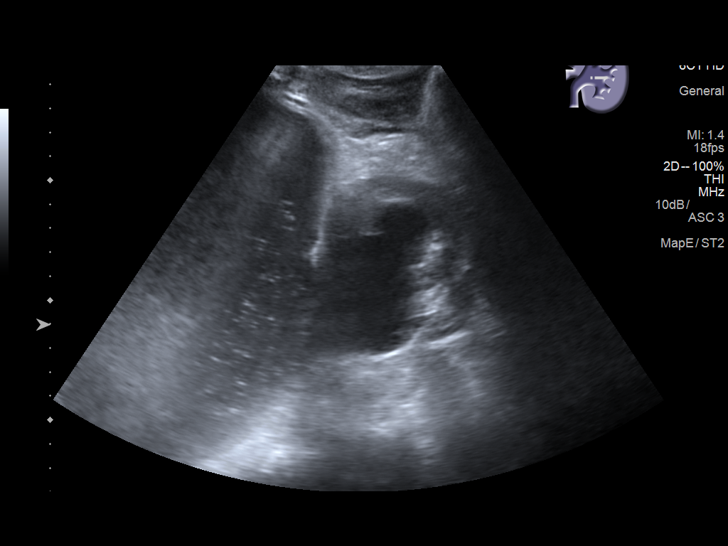
[im 75/75]
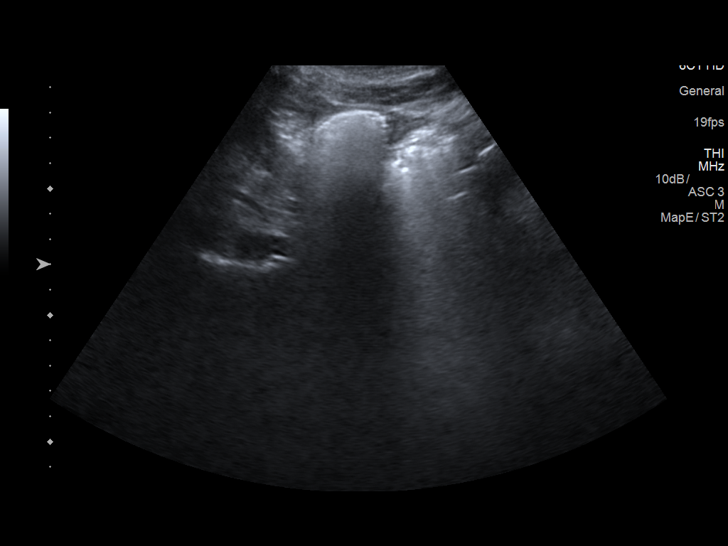

[13 of 25 positions shown; findings below may reference images not displayed]

FINDINGS: Gallbladder: 15 mm gallstone. No gallbladder wall thickening,
pericholecystic fluid or other secondary signs of acute
cholecystitis. No sonographic Murphy's sign.

Common bile duct: Diameter: Normal at 3 mm.

Liver: No focal lesion identified. Within normal limits in
parenchymal echogenicity. Portal vein is patent on color Doppler
imaging with normal direction of blood flow towards the liver.

IVC: No abnormality visualized.

Pancreas: Visualized portion unremarkable.

Spleen: Obscured by the overlying stomach which appears to be
distended with recently ingested material.

Right Kidney: Length: 11.3 cm. Mild hydronephrosis. Cortical
thickness and echogenicity is within normal limits without mass. No
renal stones seen.

Left Kidney: Length: 12.8 cm. Moderate hydronephrosis.. Cortical
thickness and echogenicity is within normal limits. No mass. Lower
pole left renal stone measures 1.7 cm greatest dimension.

Abdominal aorta: No aneurysm visualized.

Other findings: Bladder is decompressed.
IMPRESSION: 1. Moderate left-sided hydronephrosis. Mild right-sided
hydronephrosis. Consider CT abdomen and pelvis for further
characterization. Bladder is decompressed. Left renal stone measures
1.7 cm.
2. Cholelithiasis without evidence of acute cholecystitis. No bile
duct dilatation.
3. Spleen is obscured by the overlying stomach which appears to be
distended with recently ingested fluid/material.
